# Patient Record
Sex: Male | Born: 1985 | Hispanic: No | Marital: Single | Smoking: Current some day smoker
Health system: Southern US, Community
[De-identification: ages and names within clinical notes are randomized; demographics above are authoritative.]

---
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
## 2020-06-14 ENCOUNTER — Other Ambulatory Visit: Payer: Self-pay

## 2020-06-14 ENCOUNTER — Emergency Department (HOSPITAL_COMMUNITY)
Admission: EM | Admit: 2020-06-14 | Discharge: 2020-06-14 | Disposition: A | Discharge status: Home / Self Care | Payer: Self-pay | Attending: Emergency Medicine | Admitting: Emergency Medicine

## 2020-06-14 DIAGNOSIS — Z5321 Procedure and treatment not carried out due to patient leaving prior to being seen by health care provider: Secondary | ICD-10-CM | POA: Insufficient documentation

## 2020-06-14 DIAGNOSIS — R110 Nausea: Secondary | ICD-10-CM | POA: Insufficient documentation

## 2020-06-14 DIAGNOSIS — R109 Unspecified abdominal pain: Secondary | ICD-10-CM | POA: Insufficient documentation

## 2020-06-14 NOTE — ED Provider Notes (Signed)
Patient left without being seen by provider.  He reportedly ambulated from the department.  I did not have a chance to fully evaluate this patient, however, on my way to see a reportedly critical patient, I did see him sitting in the room on a chair.  No noted pallor, diaphoresis, or evidence of obvious distress.  Vitals:   06/14/20 1317  BP: 139/84  Pulse: 89  Resp: 14  Temp: 98.7 F (37.1 C)  TempSrc: Oral  SpO2: 96%  Weight: 86.2 kg  Height: 5\' 9"  (1.753 m)      , PA-C 06/14/20 1444    Tegeler, 06/16/20, MD 06/16/20 [phone]

## 2020-06-14 NOTE — ED Notes (Addendum)
Left with out being seen @1402 , patient not in room

## 2020-06-14 NOTE — ED Triage Notes (Signed)
Pt with hx of multiple kidney stones here for eval of L flank pain onset 0600 this morning. Endorses nausea without vomiting. Denies urinary complaints.

## 2020-08-29 ENCOUNTER — Encounter (HOSPITAL_COMMUNITY): Payer: Self-pay | Admitting: Psychiatry

## 2020-08-29 ENCOUNTER — Ambulatory Visit (INDEPENDENT_AMBULATORY_CARE_PROVIDER_SITE_OTHER): Payer: No Payment, Other | Admitting: Psychiatry

## 2020-08-29 ENCOUNTER — Other Ambulatory Visit: Payer: Self-pay

## 2020-08-29 DIAGNOSIS — F1590 Other stimulant use, unspecified, uncomplicated: Secondary | ICD-10-CM

## 2020-08-29 DIAGNOSIS — F1121 Opioid dependence, in remission: Secondary | ICD-10-CM | POA: Diagnosis not present

## 2020-08-29 DIAGNOSIS — F3342 Major depressive disorder, recurrent, in full remission: Secondary | ICD-10-CM | POA: Diagnosis not present

## 2020-08-29 DIAGNOSIS — B1920 Unspecified viral hepatitis C without hepatic coma: Secondary | ICD-10-CM

## 2020-08-29 MED ORDER — GUANFACINE HCL ER 1 MG PO TB24: 1.0000 mg | ORAL_TABLET | Freq: Every morning | ORAL | 1 refills | Status: DC

## 2020-08-29 MED ORDER — SERTRALINE HCL 100 MG PO TABS: 100.0000 mg | ORAL_TABLET | Freq: Every day | ORAL | 1 refills | Status: DC

## 2020-08-29 MED ORDER — GABAPENTIN 100 MG PO CAPS: 100.0000 mg | ORAL_CAPSULE | Freq: Three times a day (TID) | ORAL | 1 refills | Status: DC

## 2020-08-29 NOTE — Progress Notes (Signed)
Psychiatric Initial Adult Assessment   Virtual Visit via Video Note  I connected with Zachary Freeman on 08/29/20 at  9:00 AM EDT by a video enabled telemedicine application and verified that I am speaking with the correct person using two identifiers.  Location: Patient: Home Provider: Clinic   I discussed the limitations of evaluation and management by telemedicine and the availability of in person appointments. The patient expressed understanding and agreed to proceed.  I provided 35 minutes of non-face-to-face time during this encounter. Extensive amount of time was spent in reviewing his discharge summary and other paperwork from old [facility] where he was recently hospitalized.    Patient Identification: Zachary Freeman MRN:  [id number] Date of Evaluation:  08/29/2020  Referral Source: Self  Chief Complaint:    Visit Diagnosis:    ICD-10-CM   1. MDD (major depressive disorder), recurrent, in full remission (HCC)  F33.42 sertraline (ZOLOFT) 100 MG tablet    2. Opioid use disorder, severe, in early remission (HCC)  F11.21 gabapentin (NEURONTIN) 100 MG capsule    guanFACINE (INTUNIV) 1 MG TB24 ER tablet    3. Stimulant use disorder, in early remission  F15.90 gabapentin (NEURONTIN) 100 MG capsule    guanFACINE (INTUNIV) 1 MG TB24 ER tablet    4. Hepatitis C test positive  B19.20       History of Present Illness: This is a 35 year old male with history of mood disorder, severe opioid use disorder and stimulant use disorder in addition to hepatitis C now seen for evaluation after he contacted the clinic himself for an appointment.  Patient was recently discharged charged from old [facility], he was hospitalized there from May 7 to May16.  He was hospitalized after presenting there with acute suicidal ideations while undergoing opioid and stimulant withdrawals.  He was medically stabilized and was discharged on the following medications-sertraline 100 mg daily,  gabapentin 100 mg 3 times daily, guanfacine ER 1 mg every morning.  Patient stated that he has been sober for 51 days now and is very happy about that.  He stated that he is back to work, works in [employer] representative and is really glad to be back.  He stated that he really wants to put that part of his life behind him and do well for himself and his family.  He informed that he has been attending NA meetings every day and also has a sponsor now. He stated that the medications that he was prescribed really helped him and he ran out of them about 6 or 7 days ago and he really wants to get back on them as they were very helpful. He stated that he still struggles with poor concentration and used to take Ritalin as a child.  He stated that as he grew older he started misusing various illicit substances and also prescription medications. He stated that he got hooked onto opioid medications and also Adderall.  He stated that he does not have any intention to return back to taking Adderall or Ritalin because he knows that may bring him back to where he was.  He politely asked for refills on the same regimen that he was discharged on because he just wants to continue the same combination of medications as really helped his mood and also his anxiety. He denied any symptoms just of depression such as anhedonia or sad mood.  He is sleeping well at night and his appetite is good.  He denies any suicidal ideations or intention to hurt  himself.  He denied any homicidal ideations.  He denied any hallucinations or delusions.  He lives with his significant other and currently does not have any children. He is hoping to continue to keep this job and make the setting up for his family.  He denied any consumption of alcohol or use of any illicit substances in the last 51 days.  He denied any symptoms suggestive of mania or hypomania. He denied any symptom suggestive of PTSD.  Past Psychiatric History: Extensive history of  substance abuse, opioid use disorder and stimulant use disorder.  Recent psychiatric hospitalization in May 2022.  Previous Psychotropic Medications: Yes   Substance Abuse History in the last 12 months:  Yes.    Consequences of Substance Abuse: Medical Consequences:  needed hospitalization for medical stabilization, contracted hepatitis C Withdrawal Symptoms:   Cramps Diaphoresis Nausea Tremors Vomiting  Past Medical History: Hepatitis C positive  Family Psychiatric History: Denied  Family History: No family history on file.  Social History:   Social History   Socioeconomic History   Marital status: Single    Spouse name: Not on file   Number of children: Not on file   Years of education: Not on file   Highest education level: Not on file  Occupational History   Not on file  Tobacco Use   Smoking status: Not on file   Smokeless tobacco: Not on file  Substance and Sexual Activity   Alcohol use: Not on file   Drug use: Not on file   Sexual activity: Not on file  Other Topics Concern   Not on file  Social History Narrative   Not on file   Social Determinants of Health   Financial Resource Strain: Not on file  Food Insecurity: Not on file  Transportation Needs: Not on file  Physical Activity: Not on file  Stress: Not on file  Social Connections: Not on file    Additional Social History: Lives with his wife, works in the Holiday representative business.  Has his own vehicle.  Does not have any children at present.  Attends NA meetings daily and has a sponsor.  Allergies:  No Known Allergies  Metabolic Disorder Labs: No results found for: HGBA1C, MPG No results found for: PROLACTIN No results found for: CHOL, TRIG, HDL, CHOLHDL, VLDL, LDLCALC No results found for: TSH  Therapeutic Level Labs: No results found for: LITHIUM No results found for: CBMZ No results found for: VALPROATE  Current Medications: Current Outpatient Medications  Medication Sig Dispense Refill    gabapentin (NEURONTIN) 100 MG capsule Take 1 capsule (100 mg total) by mouth 3 (three) times daily. 90 capsule 1   guanFACINE (INTUNIV) 1 MG TB24 ER tablet Take 1 tablet (1 mg total) by mouth in the morning. 30 tablet 1   sertraline (ZOLOFT) 100 MG tablet Take 1 tablet (100 mg total) by mouth daily. 30 tablet 1   No current facility-administered medications for this visit.     Psychiatric Specialty Exam: Review of Systems  There were no vitals taken for this visit.There is no height or weight on file to calculate BMI.  General Appearance: Fairly Groomed  Eye Contact:  Good  Speech:  Clear and Coherent and Normal Rate  Volume:  Normal  Mood:  Euthymic  Affect:  Congruent  Thought Process:  Goal Directed, Linear, and Descriptions of Associations: Intact  Orientation:  Full (Time, Place, and Person)  Thought Content:  Logical  Suicidal Thoughts:  No  Homicidal Thoughts:  No  Memory:  Immediate;   Good Recent;   Good Remote;   Fair  Judgement:  Fair  Insight:  Fair  Psychomotor Activity:  Normal  Concentration:  Concentration: Good and Attention Span: Good  Recall:  Good  Fund of Knowledge:Good  Language: Good  Akathisia:  Negative  Handed:  Right  AIMS (if indicated):  not done  Assets:  Communication Skills Desire for Improvement Housing Transportation Vocational/Educational  ADL's:  Intact  Cognition: WNL  Sleep:  Good   Screenings: GAD-7    Flowsheet Row Office Visit from 08/29/2020 in [facility]  Total GAD-7 Score 2      PHQ2-9    Flowsheet Row Office Visit from 08/29/2020 in [facility]  PHQ-2 Total Score 0      Flowsheet Row Office Visit from 08/29/2020 in [facility] ED from 06/14/2020 in [facility] EMERGENCY DEPARTMENT  C-SSRS RISK CATEGORY No Risk No Risk       Assessment and Plan: Patient seen for establishing care after his hospital  discharge last month.  He is in early remission from severe opioid and stimulant use disorder.  He has been attending any meetings daily and also has a sponsor now.  He is trying his best to maintain his sobriety.  He has a job now and he is planning to maintain it. He would like to continue the same regimen he was discharged on from the hospital.  Refills were sent to his preferred pharmacy.  1. MDD (major depressive disorder), recurrent, in full remission (HCC)  - sertraline (ZOLOFT) 100 MG tablet; Take 1 tablet (100 mg total) by mouth daily.  Dispense: 30 tablet; Refill: 1  2. Opioid use disorder, severe, in early remission (HCC)  - gabapentin (NEURONTIN) 100 MG capsule; Take 1 capsule (100 mg total) by mouth 3 (three) times daily.  Dispense: 90 capsule; Refill: 1 - guanFACINE (INTUNIV) 1 MG TB24 ER tablet; Take 1 tablet (1 mg total) by mouth in the morning.  Dispense: 30 tablet; Refill: 1  3. Stimulant use disorder, in early remission  - gabapentin (NEURONTIN) 100 MG capsule; Take 1 capsule (100 mg total) by mouth 3 (three) times daily.  Dispense: 90 capsule; Refill: 1 - guanFACINE (INTUNIV) 1 MG TB24 ER tablet; Take 1 tablet (1 mg total) by mouth in the morning.  Dispense: 30 tablet; Refill: 1  4. Hepatitis C test positive  Follow-up in 2 months. Patient was informed that his care will be transferred to a different provider as Clinical research associate is leaving the office.   Zena Amos, MD 6/28/20229:50 AM

## 2020-10-20 ENCOUNTER — Other Ambulatory Visit: Payer: Self-pay

## 2020-10-20 ENCOUNTER — Telehealth (HOSPITAL_COMMUNITY): Payer: No Payment, Other | Admitting: Physician Assistant

## 2020-10-22 ENCOUNTER — Encounter (HOSPITAL_COMMUNITY): Payer: Self-pay

## 2020-10-22 ENCOUNTER — Other Ambulatory Visit: Payer: Self-pay

## 2020-10-22 ENCOUNTER — Other Ambulatory Visit (HOSPITAL_COMMUNITY)
Admission: EM | Admit: 2020-10-22 | Discharge: 2020-10-23 | Disposition: A | Discharge status: Home / Self Care | Payer: No Payment, Other | Source: Intra-hospital | Attending: Behavioral Health | Admitting: Behavioral Health

## 2020-10-22 ENCOUNTER — Emergency Department (HOSPITAL_COMMUNITY)
Admission: EM | Admit: 2020-10-22 | Discharge: 2020-10-22 | Disposition: A | Discharge status: Other Acute Inpatient Hospital | Payer: Self-pay | Attending: Emergency Medicine | Admitting: Emergency Medicine

## 2020-10-22 DIAGNOSIS — Y900 Blood alcohol level of less than 20 mg/100 ml: Secondary | ICD-10-CM | POA: Insufficient documentation

## 2020-10-22 DIAGNOSIS — F3342 Major depressive disorder, recurrent, in full remission: Secondary | ICD-10-CM

## 2020-10-22 DIAGNOSIS — F1114 Opioid abuse with opioid-induced mood disorder: Secondary | ICD-10-CM | POA: Diagnosis not present

## 2020-10-22 DIAGNOSIS — F17210 Nicotine dependence, cigarettes, uncomplicated: Secondary | ICD-10-CM | POA: Insufficient documentation

## 2020-10-22 DIAGNOSIS — Z79899 Other long term (current) drug therapy: Secondary | ICD-10-CM | POA: Diagnosis not present

## 2020-10-22 DIAGNOSIS — F1590 Other stimulant use, unspecified, uncomplicated: Secondary | ICD-10-CM

## 2020-10-22 DIAGNOSIS — F1121 Opioid dependence, in remission: Secondary | ICD-10-CM

## 2020-10-22 DIAGNOSIS — F1110 Opioid abuse, uncomplicated: Secondary | ICD-10-CM

## 2020-10-22 DIAGNOSIS — R45851 Suicidal ideations: Secondary | ICD-10-CM | POA: Insufficient documentation

## 2020-10-22 DIAGNOSIS — F1994 Other psychoactive substance use, unspecified with psychoactive substance-induced mood disorder: Secondary | ICD-10-CM | POA: Diagnosis present

## 2020-10-22 DIAGNOSIS — F332 Major depressive disorder, recurrent severe without psychotic features: Secondary | ICD-10-CM | POA: Insufficient documentation

## 2020-10-22 DIAGNOSIS — Z20822 Contact with and (suspected) exposure to covid-19: Secondary | ICD-10-CM | POA: Insufficient documentation

## 2020-10-22 DIAGNOSIS — F1910 Other psychoactive substance abuse, uncomplicated: Secondary | ICD-10-CM | POA: Insufficient documentation

## 2020-10-22 LAB — RESP PANEL BY RT-PCR (FLU A&B, COVID) ARPGX2
Influenza A by PCR: NEGATIVE
Influenza B by PCR: NEGATIVE
SARS Coronavirus 2 by RT PCR: NEGATIVE

## 2020-10-22 LAB — RAPID URINE DRUG SCREEN, HOSP PERFORMED
Amphetamines: NOT DETECTED
Barbiturates: NOT DETECTED
Benzodiazepines: NOT DETECTED
Cocaine: NOT DETECTED
Opiates: NOT DETECTED
Tetrahydrocannabinol: NOT DETECTED

## 2020-10-22 LAB — CBC WITH DIFFERENTIAL/PLATELET
Abs Immature Granulocytes: 0.07 10*3/uL (ref 0.00–0.07)
Basophils Absolute: 0 10*3/uL (ref 0.0–0.1)
Basophils Relative: 0 %
Eosinophils Absolute: 0 10*3/uL (ref 0.0–0.5)
Eosinophils Relative: 0 %
HCT: 38.5 % — ABNORMAL LOW (ref 39.0–52.0)
Hemoglobin: 13.2 g/dL (ref 13.0–17.0)
Immature Granulocytes: 0 %
Lymphocytes Relative: 9 %
Lymphs Abs: 1.6 10*3/uL (ref 0.7–4.0)
MCH: 29.2 pg (ref 26.0–34.0)
MCHC: 34.3 g/dL (ref 30.0–36.0)
MCV: 85.2 fL (ref 80.0–100.0)
Monocytes Absolute: 1.2 10*3/uL — ABNORMAL HIGH (ref 0.1–1.0)
Monocytes Relative: 7 %
Neutro Abs: 15.3 10*3/uL — ABNORMAL HIGH (ref 1.7–7.7)
Neutrophils Relative %: 84 %
Platelets: 226 10*3/uL (ref 150–400)
RBC: 4.52 MIL/uL (ref 4.22–5.81)
RDW: 12.9 % (ref 11.5–15.5)
WBC: 18.2 10*3/uL — ABNORMAL HIGH (ref 4.0–10.5)
nRBC: 0 % (ref 0.0–0.2)

## 2020-10-22 LAB — SALICYLATE LEVEL: Salicylate Lvl: <7 mg/dL — ABNORMAL LOW (ref 7.0–30.0)

## 2020-10-22 LAB — LIPID PANEL
Cholesterol: 180 mg/dL (ref 0–200)
HDL: 48 mg/dL (ref >40)
LDL Cholesterol: 121 mg/dL — ABNORMAL HIGH (ref 0–99)
Total CHOL/HDL Ratio: 3.8 RATIO
Triglycerides: 57 mg/dL (ref <150)
VLDL: 11 mg/dL (ref 0–40)

## 2020-10-22 LAB — HEMOGLOBIN A1C
Hgb A1c MFr Bld: 5.4 % (ref 4.8–5.6)
Mean Plasma Glucose: 108.28 mg/dL

## 2020-10-22 LAB — COMPREHENSIVE METABOLIC PANEL
ALT: 34 U/L (ref 0–44)
AST: 25 U/L (ref 15–41)
Albumin: 4.6 g/dL (ref 3.5–5.0)
Alkaline Phosphatase: 43 U/L (ref 38–126)
Anion gap: 8 (ref 5–15)
BUN: 17 mg/dL (ref 6–20)
CO2: 25 mmol/L (ref 22–32)
Calcium: 9.1 mg/dL (ref 8.9–10.3)
Chloride: 103 mmol/L (ref 98–111)
Creatinine, Ser: 0.83 mg/dL (ref 0.61–1.24)
GFR, Estimated: >60 mL/min (ref >60)
Glucose, Bld: 109 mg/dL — ABNORMAL HIGH (ref 70–99)
Potassium: 3.9 mmol/L (ref 3.5–5.1)
Sodium: 136 mmol/L (ref 135–145)
Total Bilirubin: 1.1 mg/dL (ref 0.3–1.2)
Total Protein: 7.6 g/dL (ref 6.5–8.1)

## 2020-10-22 LAB — ACETAMINOPHEN LEVEL: Acetaminophen (Tylenol), Serum: <10 ug/mL — ABNORMAL LOW (ref 10–30)

## 2020-10-22 LAB — TSH: TSH: 0.61 u[IU]/mL (ref 0.350–4.500)

## 2020-10-22 LAB — ETHANOL: Alcohol, Ethyl (B): <10 mg/dL (ref <10)

## 2020-10-22 MED ORDER — CLONIDINE HCL 0.1 MG PO TABS: 0.1000 mg | ORAL_TABLET | Freq: Four times a day (QID) | ORAL | Status: DC

## 2020-10-22 MED ORDER — TRAZODONE HCL 50 MG PO TABS
50.0000 mg | ORAL_TABLET | Freq: Every evening | ORAL | Status: DC | PRN
Start: 2020-10-22 — End: 2020-10-23

## 2020-10-22 MED ORDER — HYDROXYZINE HCL 25 MG PO TABS: 25.0000 mg | ORAL_TABLET | Freq: Three times a day (TID) | ORAL | Status: DC | PRN

## 2020-10-22 MED ORDER — MAGNESIUM HYDROXIDE 400 MG/5ML PO SUSP: 30.0000 mL | Freq: Every day | ORAL | Status: DC | PRN

## 2020-10-22 MED ORDER — DICYCLOMINE HCL 20 MG PO TABS: 20.0000 mg | ORAL_TABLET | Freq: Four times a day (QID) | ORAL | Status: DC | PRN

## 2020-10-22 MED ORDER — ALUM & MAG HYDROXIDE-SIMETH 200-200-20 MG/5ML PO SUSP: 30.0000 mL | Freq: Four times a day (QID) | ORAL | Status: DC | PRN

## 2020-10-22 MED ORDER — SERTRALINE HCL 50 MG PO TABS: 100.0000 mg | ORAL_TABLET | Freq: Every day | ORAL | Status: DC

## 2020-10-22 MED ORDER — CLONIDINE HCL 0.1 MG PO TABS: 0.1000 mg | ORAL_TABLET | Freq: Two times a day (BID) | ORAL | Status: DC

## 2020-10-22 MED ORDER — ALUM & MAG HYDROXIDE-SIMETH 200-200-20 MG/5ML PO SUSP
30.0000 mL | ORAL | Status: DC | PRN
Start: 2020-10-22 — End: 2020-10-23

## 2020-10-22 MED ORDER — HYDROXYZINE HCL 25 MG PO TABS: 25.0000 mg | ORAL_TABLET | Freq: Four times a day (QID) | ORAL | Status: DC | PRN

## 2020-10-22 MED ORDER — METHOCARBAMOL 500 MG PO TABS: 500.0000 mg | ORAL_TABLET | Freq: Three times a day (TID) | ORAL | Status: DC | PRN

## 2020-10-22 MED ORDER — GABAPENTIN 100 MG PO CAPS
100.0000 mg | ORAL_CAPSULE | Freq: Three times a day (TID) | ORAL | Status: DC
  Administered 2020-10-22 – 2020-10-23 (×4): 100 mg via ORAL
  Filled 2020-10-22 (×4): qty 1

## 2020-10-22 MED ORDER — ACETAMINOPHEN 325 MG PO TABS: 650.0000 mg | ORAL_TABLET | ORAL | Status: DC | PRN

## 2020-10-22 MED ORDER — LOPERAMIDE HCL 2 MG PO CAPS: 2.0000 mg | ORAL_CAPSULE | ORAL | Status: DC | PRN

## 2020-10-22 MED ORDER — ONDANSETRON 4 MG PO TBDP: 4.0000 mg | ORAL_TABLET | Freq: Four times a day (QID) | ORAL | Status: DC | PRN

## 2020-10-22 MED ORDER — SERTRALINE HCL 50 MG PO TABS
50.0000 mg | ORAL_TABLET | Freq: Every day | ORAL | Status: DC
  Administered 2020-10-22 – 2020-10-23 (×2): 50 mg via ORAL
  Filled 2020-10-22 (×2): qty 1

## 2020-10-22 MED ORDER — NICOTINE 21 MG/24HR TD PT24: 21.0000 mg | MEDICATED_PATCH | Freq: Every day | TRANSDERMAL | Status: DC

## 2020-10-22 MED ORDER — ACETAMINOPHEN 325 MG PO TABS: 650.0000 mg | ORAL_TABLET | Freq: Four times a day (QID) | ORAL | Status: DC | PRN

## 2020-10-22 MED ORDER — CLONIDINE HCL 0.1 MG PO TABS: 0.1000 mg | ORAL_TABLET | Freq: Four times a day (QID) | ORAL | Status: DC | PRN

## 2020-10-22 MED ORDER — NAPROXEN 500 MG PO TABS: 500.0000 mg | ORAL_TABLET | Freq: Two times a day (BID) | ORAL | Status: DC | PRN

## 2020-10-22 MED ORDER — CLONIDINE HCL 0.1 MG PO TABS
0.1000 mg | ORAL_TABLET | Freq: Every day | ORAL | Status: DC
Start: 2020-10-26 — End: 2020-10-22

## 2020-10-22 NOTE — ED Provider Notes (Signed)
[facility] Admission Suicide Risk Assessment   Nursing information obtained from:   Medical records, and MD/nursing report. Demographic factors:   caucasian, male Current Mental Status:   alert and oriented. Loss Factors:    Historical Factors: Hx of substance abuse   Total Time spent with patient: 20 minutes Diagnoses:  Final diagnoses:  Substance induced mood disorder (HCC)  Opioid use disorder   Subjective Data: Patient seen and examined face to face by this provider and chart reviewed. On evaluation, the patient is alert and oriented x4. His thought process is logical and speech is coherent. His mood is dysphoric and affect is congruent. He has fair eye contact.  He denies suicidal ideations. He denies homicidal ideations. He denies auditory and visual hallucinations. He does not appear to be responding to internal or external stimuli. He denies paranoia. He states that he was kicked out of the [facility], rather told to leave to seek treatment after he relapsed on Fentanyl. He reported using a half gram of fentanyl. He denies having withdrawal symptoms at this time. He states that prior to relapsing on Fentanyl last night, he was sober for 4 months. He states that he was last hospitalized at [facility] for substance abuse treatment in May 2022.  He states that he was at [facility] in March 2022 for detox. He states that he completed a residential substance abuse program in January 2022. 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 He reports feeling depressed and suicidal with no plan or intent for a couple weeks since he stopped taking his medications. He describes his depressive symptoms as sadness, worthlessness, hopelessness, and isolating. He reports that he was prescribed Zoloft for his depression by Dr Evelene Croon. He states that the Zoloft was effective for treating his  depression. He reports feeling anxious, and describes his anxiety symptoms as sweating, worrying, and racing thoughts. He states that he was prescribed gabapentin for anxiety and states that the gabapentin was effective. He states that he was following up at the [facility] for outpatient  medication management with Dr. Evelene Croon, but not recently. We discussed restarting the Zoloft and gabapentin for mood stabilization.  Patient agrees to the stated medication regimen.  Continued Clinical Symptoms:    The "Alcohol Use Disorders Identification Test", Guidelines for Use in Primary Care, Second Edition.  World Science writer [facility]). Score between 0-7:  no or low risk or alcohol related problems. Score between 8-15:  moderate risk of alcohol related problems. Score between 16-19:  high risk of alcohol related problems. Score 20 or above:  warrants further diagnostic evaluation for alcohol dependence and treatment.   CLINICAL FACTORS:   Depression:   Hopelessness Alcohol/Substance Abuse/Dependencies   Musculoskeletal: Strength & Muscle Tone: within normal limits Gait & Station: normal Patient leans: N/A  Psychiatric Specialty Exam:  Presentation  General Appearance:  Appropriate for Environment Eye Contact: Fair Speech: Clear and Coherent Speech Volume: Normal Handedness: Right  Mood and Affect  Mood: Dysphoric Affect: Congruent  Thought Process  Thought Processes: Coherent; Goal Directed Descriptions of Associations:Intact Orientation:Full (Time, Place and Person) Thought Content:WDL History of Schizophrenia/Schizoaffective disorder:No  Duration of Psychotic Symptoms:No data recorded Hallucinations:Hallucinations: None Ideas of Reference:None Suicidal Thoughts:Suicidal Thoughts: No Homicidal Thoughts:Homicidal Thoughts: No  Sensorium  Memory: Immediate Fair; Recent Fair; Remote Fair Judgment: Fair Insight: Fair  Art therapist  Concentration: Fair Attention Span: Fair Recall: [employer] of Knowledge: Fair Language: Fair  Psychomotor Activity  Psychomotor Activity: Psychomotor Activity: Normal  Assets  Assets: Manufacturing systems engineer; Desire for Improvement; Housing; Health and safety inspector; Leisure Time; Physical Health; Social Support  Sleep  Sleep: Sleep: Fair Number of Hours of Sleep: 6   Physical Exam: Physical Exam HENT:     Head: Normocephalic.     Nose: Nose normal.  Eyes:     Conjunctiva/sclera: Conjunctivae normal.  Cardiovascular:     Rate and Rhythm: Normal rate.  Musculoskeletal:        General: Normal range of motion.     Cervical back: Normal range of motion.  Neurological:     Mental Status: He is alert and oriented to person, place, and time.   Review of Systems  Constitutional: Negative.   HENT: Negative.    Eyes: Negative.   Respiratory: Negative.    Cardiovascular: Negative.   Gastrointestinal: Negative.   Genitourinary: Negative.   Musculoskeletal: Negative.   Skin: Negative.   Neurological: Negative.   Endo/Heme/Allergies: Negative.   Psychiatric/Behavioral:  Positive for depression and substance abuse. Negative for hallucinations and suicidal ideas.    Blood pressure 131/79, pulse 86, temperature 97.7 F (36.5 C), temperature source Tympanic, resp. rate 18, SpO2 100 %. There is no height or weight on file to calculate BMI.   COGNITIVE FEATURES THAT CONTRIBUTE TO RISK:  None    SUICIDE RISK:   Minimal: No identifiable suicidal ideation.  Patients presenting with no risk factors but with morbid ruminations; may be classified as minimal risk based on the severity of the depressive symptoms  PLAN OF CARE:  Patient admitted to the [facility] behavioral health facility based crisis for safety and mood stabilization.   Medications restarted:  gabapentin  100 mg Oral TID   sertraline  50 mg Oral Daily  *Vistaril 25 mg po TID PRN for  anxiety *Trazodone 50 mg QHS PRN for sleep. *Clonidine 0.1 mg PO q 6 hours for opiate withdrawal symptoms  *COWS scale every 8 hours    Lab Orders         Hemoglobin A1c         Lipid panel         TSH     Labs reviewed.   I certify that inpatient services furnished can reasonably be expected to improve the patient's condition.   Ethelene Closser L, NP 10/22/2020, 10:41 AM

## 2020-10-22 NOTE — ED Provider Notes (Addendum)
Behavioral Health Admission H&P [facility] & OBS)  Date: 10/22/20 Patient Name: Zachary Freeman MRN: [id number] Chief Complaint:   "Off of Zoloft for the past several weeks. He reports that since going off the medication he has had increased depression and passive suicidal thinking."    Diagnoses:  Final diagnoses:  Substance induced mood disorder (HCC)  Opioid abuse (HCC)    HPI: Patient seen and examined face to face by this provider and chart reviewed. On evaluation, the patient is alert and oriented x4. His thought process is logical and speech is coherent. His mood is dysphoric and affect is congruent. He has fair eye contact.  He denies suicidal ideations. He denies homicidal ideations. He denies auditory and visual hallucinations. He does not appear to be responding to internal or external stimuli. He denies paranoia. He states that he was kicked out of the [facility], rather told to leave to seek treatment after he relapsed on Fentanyl. He reported using a half gram of fentanyl. He denies having withdrawal symptoms at this time. He states that prior to relapsing on Fentanyl last night, he was sober for 4 months. He states that he was last hospitalized at [facility] for substance abuse treatment in May 2022.  He states that he was at [facility] in March 2022 for detox. He states that he completed a residential substance abuse program in January 2022. 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 He reports feeling depressed and suicidal with no plan or intent for a couple weeks since he stopped taking his medications. He describes his depressive symptoms as sadness, worthlessness, hopelessness, and isolating. He reports that he was prescribed Zoloft for his depression by Dr Evelene Croon. He states that the Zoloft was effective for treating his depression. He reports feeling anxious,  and describes his anxiety symptoms as sweating, worrying, and racing thoughts. He states that he was prescribed gabapentin for anxiety and states that the gabapentin was effective. He states that he was following up at the [facility] for outpatient  medication management with Dr. Evelene Croon, but not recently. We discussed restarting the Zoloft and gabapentin for mood stabilization.  Patient agrees to the stated medication regimen.  PHQ 2-9:   Flowsheet Row ED from 10/22/2020 in [facility] Most recent reading at 10/22/2020  8:50 AM ED from 10/22/2020 in [facility] Most recent reading at 10/22/2020  2:50 AM Office Visit from 08/29/2020 in [facility] Most recent reading at 08/29/2020  9:49 AM  C-SSRS RISK CATEGORY Low Risk Moderate Risk No Risk        Total Time spent with patient: 30 minutes  Musculoskeletal  Strength & Muscle Tone: within normal limits Gait & Station: normal Patient leans: N/A  Psychiatric Specialty Exam  Presentation General Appearance: Appropriate for Environment  Eye Contact:Fair  Speech:Clear and Coherent  Speech Volume:Normal  Handedness:Right   Mood and Affect  Mood:Dysphoric  Affect:Congruent   Thought Process  Thought Processes:Coherent; Goal Directed  Descriptions of Associations:Intact  Orientation:Full (Time, Place and Person)  Thought Content:WDL  Diagnosis of Schizophrenia or Schizoaffective disorder in past: No   Hallucinations:Hallucinations: None  Ideas of Reference:None  Suicidal Thoughts:Suicidal Thoughts: No  Homicidal Thoughts:Homicidal Thoughts: No   Sensorium  Memory:Immediate Fair; Recent Fair; Remote Fair  Judgment:Fair  Insight:Fair   Art therapist  Concentration:Fair  Attention Span:Fair  Recall:Fair  Fund of Knowledge:Fair  Language:Fair   Psychomotor Activity  Psychomotor Activity:Psychomotor Activity:  Normal   Assets  Assets:Communication Skills; Desire for Improvement; Housing; Health and safety inspectorinancial Resources/Insurance; Leisure Time; Physical Health; Social Support   Sleep  Sleep:Sleep: Fair Number of Hours of Sleep: 6   Nutritional Assessment (For OBS and FBC admissions only) Has the patient had a weight loss or gain of 10 pounds or more in the last 3 months?: No Has the patient had a decrease in food intake/or appetite?: No Does the patient have dental problems?: No Does the patient have eating habits or behaviors that may be indicators of an eating disorder including binging or inducing vomiting?: No Has the patient recently lost weight without trying?: No Has the patient been eating poorly because of a decreased appetite?: No Malnutrition Screening Tool Score: 0   Physical Exam HENT:     Head: Normocephalic.     Nose: Nose normal.  Eyes:     Conjunctiva/sclera: Conjunctivae normal.  Cardiovascular:     Rate and Rhythm: Normal rate.  Pulmonary:     Effort: Pulmonary effort is normal.  Musculoskeletal:     Cervical back: Normal range of motion.  Neurological:     Mental Status: He is alert and oriented to person, place, and time.   Review of Systems  Constitutional: Negative.   HENT: Negative.    Eyes: Negative.   Respiratory: Negative.    Cardiovascular: Negative.   Gastrointestinal: Negative.   Genitourinary: Negative.   Musculoskeletal: Negative.   Skin: Negative.   Neurological: Negative.   Endo/Heme/Allergies: Negative.   Psychiatric/Behavioral:  Positive for depression and substance abuse. The patient is nervous/anxious.    Blood pressure 131/79, pulse 86, temperature 97.7 F (36.5 C), temperature source Tympanic, resp. rate 18, SpO2 100 %. There is no height or weight on file to calculate BMI.  Past Psychiatric History: Hx of MDD, Opioid use dx, and Stimulant use disorder.  Is the patient at risk to self? Yes  Has the patient been a risk to self in the past 6  months? Yes .    Has the patient been a risk to self within the distant past? Yes   Is the patient a risk to others? No   Has the patient been a risk to others in the past 6 months? No   Has the patient been a risk to others within the distant past? No   Past Medical History: No past medical history on file. No past surgical history on file.  Family History: Patient does not report a family hx of mental illness.  Social History: Opiate use. Works full-time as a Location managercrawl space worker. Social History   Socioeconomic History   Marital status: Single    Spouse name: Not on file   Number of children: Not on file   Years of education: Not on file   Highest education level: Not on file  Occupational History   Not on file  Tobacco Use   Smoking status: Not on file   Smokeless tobacco: Not on file  Substance and Sexual Activity   Alcohol use: Not on file   Drug use: Not on file   Sexual activity: Not on file  Other Topics Concern   Not on file  Social History Narrative   Not on file   Social Determinants of Health   Financial Resource Strain: Not on file  Food Insecurity: Not on file  Transportation Needs: Not on  file  Physical Activity: Not on file  Stress: Not on file  Social Connections: Not on file  Intimate Partner Violence: Not on file    SDOH:  SDOH Screenings   Alcohol Screen: Not on file  Depression (PHQ2-9): Low Risk    PHQ-2 Score: 0  Financial Resource Strain: Not on file  Food Insecurity: Not on file  Housing: Not on file  Physical Activity: Not on file  Social Connections: Not on file  Stress: Not on file  Tobacco Use: Not on file  Transportation Needs: Not on file    Last Labs:  Admission on 10/22/2020  Component Date Value Ref Range Status   Cholesterol 10/22/2020 180  0 - 200 mg/dL Final   Triglycerides 94/85/4627 57  <150 mg/dL Final   HDL 03/50/0938 48  >40 mg/dL Final   Total CHOL/HDL Ratio 10/22/2020 3.8  RATIO Final   VLDL 10/22/2020 11  0  - 40 mg/dL Final   LDL Cholesterol 10/22/2020 121 (A) 0 - 99 mg/dL Final   Comment:        Total Cholesterol/HDL:CHD Risk Coronary Heart Disease Risk Table                     Men   Women  1/2 Average Risk   3.4   3.3  Average Risk       5.0   4.4  2 X Average Risk   9.6   7.1  3 X Average Risk  23.4   11.0        Use the calculated Patient Ratio above and the CHD Risk Table to determine the patient's CHD Risk.        ATP III CLASSIFICATION (LDL):  <100     mg/dL   Optimal  182-993  mg/dL   Near or Above                    Optimal  130-159  mg/dL   Borderline  716-967  mg/dL   High  >893     mg/dL   Very High Performed at [facility], [address], [place], [place] [address]   Admission on 10/22/2020, Discharged on 10/22/2020  Component Date Value Ref Range Status   Sodium 10/22/2020 136  135 - 145 mmol/L Final   Potassium 10/22/2020 3.9  3.5 - 5.1 mmol/L Final   Chloride 10/22/2020 103  98 - 111 mmol/L Final   CO2 10/22/2020 25  22 - 32 mmol/L Final   Glucose, Bld 10/22/2020 109 (A) 70 - 99 mg/dL Final   Glucose reference range applies only to samples taken after fasting for at least 8 hours.   BUN 10/22/2020 17  6 - 20 mg/dL Final   Creatinine, Ser 10/22/2020 0.83  0.61 - 1.24 mg/dL Final   Calcium 51/04/5850 9.1  8.9 - 10.3 mg/dL Final   Total Protein 77/82/4235 7.6  6.5 - 8.1 g/dL Final   Albumin 36/14/4315 4.6  3.5 - 5.0 g/dL Final   AST 40/10/6759 25  15 - 41 U/L Final   ALT 10/22/2020 34  0 - 44 U/L Final   Alkaline Phosphatase 10/22/2020 43  38 - 126 U/L Final   Total Bilirubin 10/22/2020 1.1  0.3 - 1.2 mg/dL Final   GFR, Estimated 10/22/2020 >60  >60 mL/min Final   Comment: (NOTE) Calculated using the CKD-EPI Creatinine Equation (2021)    Anion gap 10/22/2020 8  5 - 15 Final  Performed at [facility], [address], [place], [place] [address]   Acetaminophen (Tylenol), Serum 10/22/2020 <10 (A) 10 - 30 ug/mL Final    Comment: (NOTE) Therapeutic concentrations vary significantly. A range of 10-30 ug/mL  may be an effective concentration for many patients. However, some  are best treated at concentrations outside of this range. Acetaminophen concentrations >150 ug/mL at 4 hours after ingestion  and >50 ug/mL at 12 hours after ingestion are often associated with  toxic reactions.  Performed at [facility], [address], [place], [place] [address]    Alcohol, Ethyl (B) 10/22/2020 <10  <10 mg/dL Final   Comment: (NOTE) Lowest detectable limit for serum alcohol is 10 mg/dL.  For medical purposes only. Performed at [facility], [address], [place], [place] [address]    Salicylate Lvl 10/22/2020 <7.0 (A) 7.0 - 30.0 mg/dL Final   Performed at [facility], [address], [place], [place] [address]   WBC 10/22/2020 18.2 (A) 4.0 - 10.5 K/uL Final   RBC 10/22/2020 4.52  4.22 - 5.81 MIL/uL Final   Hemoglobin 10/22/2020 13.2  13.0 - 17.0 g/dL Final   HCT 24/26/8341 38.5 (A) 39.0 - 52.0 % Final   MCV 10/22/2020 85.2  80.0 - 100.0 fL Final   MCH 10/22/2020 29.2  26.0 - 34.0 pg Final   MCHC 10/22/2020 34.3  30.0 - 36.0 g/dL Final   RDW 96/22/2979 12.9  11.5 - 15.5 % Final   Platelets 10/22/2020 226  150 - 400 K/uL Final   nRBC 10/22/2020 0.0  0.0 - 0.2 % Final   Neutrophils Relative % 10/22/2020 84  % Final   Neutro Abs 10/22/2020 15.3 (A) 1.7 - 7.7 K/uL Final   Lymphocytes Relative 10/22/2020 9  % Final   Lymphs Abs 10/22/2020 1.6  0.7 - 4.0 K/uL Final   Monocytes Relative 10/22/2020 7  % Final   Monocytes Absolute 10/22/2020 1.2 (A) 0.1 - 1.0 K/uL Final   Eosinophils Relative 10/22/2020 0  % Final   Eosinophils Absolute 10/22/2020 0.0  0.0 - 0.5 K/uL Final   Basophils Relative 10/22/2020 0  % Final   Basophils Absolute 10/22/2020 0.0  0.0 - 0.1 K/uL Final   Immature Granulocytes 10/22/2020 0  % Final   Abs Immature Granulocytes  10/22/2020 0.07  0.00 - 0.07 K/uL Final   Performed at [facility], [address] [place], [place] [address]   Opiates 10/22/2020 NONE DETECTED  NONE DETECTED Final   Cocaine 10/22/2020 NONE DETECTED  NONE DETECTED Final   Benzodiazepines 10/22/2020 NONE DETECTED  NONE DETECTED Final   Amphetamines 10/22/2020 NONE DETECTED  NONE DETECTED Final   Tetrahydrocannabinol 10/22/2020 NONE DETECTED  NONE DETECTED Final   Barbiturates 10/22/2020 NONE DETECTED  NONE DETECTED Final   Comment: (NOTE) DRUG SCREEN FOR MEDICAL PURPOSES ONLY.  IF CONFIRMATION IS NEEDED FOR ANY PURPOSE, NOTIFY LAB WITHIN 5 DAYS.  LOWEST DETECTABLE LIMITS FOR URINE DRUG SCREEN Drug Class                     Cutoff (ng/mL) Amphetamine and metabolites    1000 Barbiturate and metabolites    200 Benzodiazepine                 200 Tricyclics and metabolites     300 Opiates and metabolites        300 Cocaine and metabolites  300 THC                            50 Performed at [facility], [address], [place], [place] [address]    SARS Coronavirus 2 by RT PCR 10/22/2020 NEGATIVE  NEGATIVE Final   Comment: (NOTE) SARS-CoV-2 target nucleic acids are NOT DETECTED.  The SARS-CoV-2 RNA is generally detectable in upper respiratory specimens during the acute phase of infection. The lowest concentration of SARS-CoV-2 viral copies this assay can detect is 138 copies/mL. A negative result does not preclude SARS-Cov-2 infection and should not be used as the sole basis for treatment or other patient management decisions. A negative result may occur with  improper specimen collection/handling, submission of specimen other than nasopharyngeal swab, presence of viral mutation(s) within the areas targeted by this assay, and inadequate number of viral copies(<138 copies/mL). A negative result must be combined with clinical observations, patient history, and  epidemiological information. The expected result is Negative.  Fact Sheet for Patients:  BloggerCourse.com  Fact Sheet for Healthcare Providers:  SeriousBroker.it  This test is no                          t yet approved or cleared by the [place] FDA and  has been authorized for detection and/or diagnosis of SARS-CoV-2 by FDA under an Emergency Use Authorization (EUA). This EUA will remain  in effect (meaning this test can be used) for the duration of the COVID-19 declaration under Section 564(b)(1) of the Act, 21 U.S.C.section 360bbb-3(b)(1), unless the authorization is terminated  or revoked sooner.       Influenza A by PCR 10/22/2020 NEGATIVE  NEGATIVE Final   Influenza B by PCR 10/22/2020 NEGATIVE  NEGATIVE Final   Comment: (NOTE) The Xpert Xpress SARS-CoV-2/FLU/RSV plus assay is intended as an aid in the diagnosis of influenza from Nasopharyngeal swab specimens and should not be used as a sole basis for treatment. Nasal washings and aspirates are unacceptable for Xpert Xpress SARS-CoV-2/FLU/RSV testing.  Fact Sheet for Patients: BloggerCourse.com  Fact Sheet for Healthcare Providers: SeriousBroker.it  This test is not yet approved or cleared by the [place] FDA and has been authorized for detection and/or diagnosis of SARS-CoV-2 by FDA under an Emergency Use Authorization (EUA). This EUA will remain in effect (meaning this test can be used) for the duration of the COVID-19 declaration under Section 564(b)(1) of the Act, 21 U.S.C. section 360bbb-3(b)(1), unless the authorization is terminated or revoked.  Performed at [facility], [address] [place], [place] [address]     Allergies: Patient has no known allergies.  PTA Medications: Zoloft, Intuniv and Neurontin.  Medical Decision Making  Patient admitted to the [facility]  behavioral health facility based crisis for safety and mood stabilization.   Medications restarted:  gabapentin  100 mg Oral TID   sertraline  50 mg Oral Daily  *Vistaril 25 mg po TID PRN for anxiety *Trazodone 50 mg QHS PRN for sleep. *Clonidine 0.1 mg PO q 6 hours for opiate withdrawal symptoms  *COWS scale every 8 hours    Lab Orders         Hemoglobin A1c         Lipid panel         TSH     Labs reviewed.   Disposition: ongoing with CSW. Patient plans to return back to  the [facility] in [place], [place].  Recommendations  Based on my evaluation the patient does not appear to have an emergency medical condition.  Layla Barter, NP 10/22/20  10:43 AM

## 2020-10-22 NOTE — ED Notes (Signed)
Pt did not participate in group session, stated he was not feeling well.

## 2020-10-22 NOTE — ED Notes (Signed)
Pt given breakfast.

## 2020-10-22 NOTE — ED Notes (Signed)
Pt sleeping but easily aroused to name being called. Received afternoon med without difficulty. Pt states, "I just want to catch up on some sleep. I appreciate all you're doing". Informed pt to notify staff with any needs or concerns. Will continue to monitor for safety.

## 2020-10-22 NOTE — ED Provider Notes (Signed)
Emergency Department Provider Note   I have reviewed the triage vital signs and the nursing notes.   HISTORY  Chief Complaint Suicidal   HPI Zachary Freeman is a 35 y.o. male with past medical history reviewed below presents to the emergency department with suicidal thoughts.  Patient has been off of his Zoloft for the past several weeks.  He reports that since going off the medication he has had increased depression and passive suicidal thinking.  He has not formulated a specific plan to harm himself but has had increasingly intrusive thoughts regarding this topic.  He is not feeling homicidal.  No auditory or visual hallucinations.  Patient does smoke and snort opiates and last smoked fentanyl this evening.  He has no medical complaints at this time.   No past medical history on file.  Patient Active Problem List   Diagnosis Date Noted   MDD (major depressive disorder), recurrent, in full remission (HCC) 08/29/2020   Opioid use disorder, severe, in early remission (HCC) 08/29/2020   Stimulant use disorder, in early remission 08/29/2020   Hepatitis C test positive 08/29/2020    No past surgical history on file.  Allergies Patient has no known allergies.  No family history on file.  Social History    Review of Systems  Constitutional: No fever/chills Eyes: No visual changes. ENT: No sore throat. Cardiovascular: Denies chest pain. Respiratory: Denies shortness of breath. Gastrointestinal: No abdominal pain.  No nausea, no vomiting.  No diarrhea.  No constipation. Genitourinary: Negative for dysuria. Musculoskeletal: Negative for back pain. Skin: Negative for rash. Neurological: Negative for headaches, focal weakness or numbness. Psychiatric: Positive SI.   10-point ROS otherwise negative.  ____________________________________________   PHYSICAL EXAM:  VITAL SIGNS: ED Triage Vitals  Enc Vitals Group     BP 10/22/20 0155 (!) 151/88     Pulse Rate 10/22/20  0155 76     Resp 10/22/20 0155 18     Temp 10/22/20 0155 98.4 F (36.9 C)     Temp Source 10/22/20 0155 Oral     SpO2 10/22/20 0155 100 %     Weight 10/22/20 0155 190 lb (86.2 kg)     Height 10/22/20 0155 5\' 9"  (1.753 m)   Constitutional: Alert and oriented. Well appearing and in no acute distress. Eyes: Conjunctivae are normal.  Head: Atraumatic. Nose: No congestion/rhinnorhea. Mouth/Throat: Mucous membranes are moist.   Neck: No stridor.   Cardiovascular: Normal rate, regular rhythm. Good peripheral circulation. Grossly normal heart sounds.   Respiratory: Normal respiratory effort.  No retractions. Lungs CTAB. Gastrointestinal: Soft and nontender. No distention.  Musculoskeletal: No lower extremity tenderness nor edema. No gross deformities of extremities. Neurologic:  Normal speech and language. No gross focal neurologic deficits are appreciated.  Skin:  Skin is warm, dry and intact. No rash noted. Psychiatric: Mood and affect are normal. Speech and behavior are normal.  ____________________________________________   LABS (all labs ordered are listed, but only abnormal results are displayed)  Labs Reviewed  COMPREHENSIVE METABOLIC PANEL - Abnormal; Notable for the following components:      Result Value   Glucose, Bld 109 (*)    All other components within normal limits  ACETAMINOPHEN LEVEL - Abnormal; Notable for the following components:   Acetaminophen (Tylenol), Serum <10 (*)    All other components within normal limits  SALICYLATE LEVEL - Abnormal; Notable for the following components:   Salicylate Lvl <7.0 (*)    All other components within normal limits  CBC  WITH DIFFERENTIAL/PLATELET - Abnormal; Notable for the following components:   WBC 18.2 (*)    HCT 38.5 (*)    Neutro Abs 15.3 (*)    Monocytes Absolute 1.2 (*)    All other components within normal limits  RESP PANEL BY RT-PCR (FLU A&B, COVID) ARPGX2  ETHANOL  RAPID URINE DRUG SCREEN, HOSP PERFORMED    ____________________________________________  RADIOLOGY   None  ____________________________________________   PROCEDURES  Procedure(s) performed:   Procedures  None ____________________________________________   INITIAL IMPRESSION / ASSESSMENT AND PLAN / ED COURSE  Pertinent labs & imaging results that were available during my care of the patient were reviewed by me and considered in my medical decision making (see chart for details).   Patient presents to the emergency department with suicidal ideation in setting of substance abuse and med noncompliance.  He is calm and cooperative here.  Not responding to internal stimuli.  Gets an honest assessment of his substance use.  No IV drug history.  No medical complaints at this time.  Vital signs are within normal limits.  Plan to restart the patient's Zoloft.  COVID is negative.  Nonspecific leukocytosis noted on labs but no infection signs or symptoms. Patient is medically clear for TTS evaluation. Detox PRN meds ordered as well. Patient denies specific EtOH withdrawal history.   05:41 AM  Evaluated by TTS. Accepted for admit to [facility]. Ready for transport after 7 AM.  ____________________________________________  FINAL CLINICAL IMPRESSION(S) / ED DIAGNOSES  Final diagnoses:  Suicidal ideation  Polysubstance abuse (HCC)     MEDICATIONS GIVEN DURING THIS VISIT:  Medications  sertraline (ZOLOFT) tablet 100 mg (has no administration in time range)  cloNIDine (CATAPRES) tablet 0.1 mg (has no administration in time range)    Followed by  cloNIDine (CATAPRES) tablet 0.1 mg (has no administration in time range)    Followed by  cloNIDine (CATAPRES) tablet 0.1 mg (has no administration in time range)  dicyclomine (BENTYL) tablet 20 mg (has no administration in time range)  hydrOXYzine (ATARAX/VISTARIL) tablet 25 mg (has no administration in time range)  loperamide (IMODIUM) capsule 2-4 mg (has no administration in time range)   methocarbamol (ROBAXIN) tablet 500 mg (has no administration in time range)  naproxen (NAPROSYN) tablet 500 mg (has no administration in time range)  ondansetron (ZOFRAN-ODT) disintegrating tablet 4 mg (has no administration in time range)  alum & mag hydroxide-simeth (MAALOX/MYLANTA) 200-200-20 MG/5ML suspension 30 mL (has no administration in time range)  nicotine (NICODERM CQ - dosed in mg/24 hours) patch 21 mg (has no administration in time range)  acetaminophen (TYLENOL) tablet 650 mg (has no administration in time range)    Note:  This document was prepared using Dragon voice recognition software and may include unintentional dictation errors.  [name], MD, [facility] Emergency Medicine    Jovon Streetman, Arlyss Repress, MD 10/22/20 [phone]

## 2020-10-22 NOTE — ED Notes (Signed)
[facility] transfer d/t c/o worsening depression, passive SI after smoking fentanyl. Pt reports opiate abuse. Calm, cooperative throughout interview process. Pt states, "I'm not really suicidal but I am depressed. [facility] because [facility] refused to get tested after I smoked some Fentanyl. I need somewhere to stay. If I don't like it here, can I leave? I don't want no rehab or anything. I'm just keeping it real with you".Provider notified of pt statements. Skin assessment completed. Oriented to unit. Meal and drink offered. At currrent, Pt verbally contract for safety. Will monitor for safety.

## 2020-10-22 NOTE — ED Notes (Signed)
Pt eating lunch in no acute distress. Denies concerns at present. Safety maintained. 
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
## 2020-10-22 NOTE — ED Notes (Signed)
Pt given lunch

## 2020-10-22 NOTE — ED Notes (Signed)
Pt given dinner  

## 2020-10-22 NOTE — ED Notes (Signed)
Patient is currently in his room

## 2020-10-22 NOTE — ED Notes (Signed)
Pt changed into behavioral health attire, belongings collected and placed in cabinet above nurses station.

## 2020-10-22 NOTE — ED Triage Notes (Addendum)
Pt reports he feels depressed and suicidal, states he was taking antidepressants but has ran out of the medication and has been so busy at work he has not had time to get the medication refilled.  Pt reports his last dose of zoloft was maybe 3 or 4 weeks ago.  Pt denies having ever harmed himself, denies having a plan to harm himself, but recently has had thoughts of ending his life. Pt is currently trying to detox on his own off of opiates.  Pt reports he smoked fentanyl tonight around midnight.

## 2020-10-22 NOTE — ED Notes (Signed)
Pt given snack. 

## 2020-10-22 NOTE — ED Notes (Signed)
Pt currently on telephone. No acute distress noted. Safety maintained. 

## 2020-10-22 NOTE — ED Notes (Signed)
Pt is in room sleeping, respirations are even and unlabored, environment check has been complete, will continue to monitor patient for safety

## 2020-10-23 DIAGNOSIS — Z79899 Other long term (current) drug therapy: Secondary | ICD-10-CM | POA: Diagnosis not present

## 2020-10-23 DIAGNOSIS — F1114 Opioid abuse with opioid-induced mood disorder: Secondary | ICD-10-CM | POA: Diagnosis not present

## 2020-10-23 DIAGNOSIS — F17210 Nicotine dependence, cigarettes, uncomplicated: Secondary | ICD-10-CM | POA: Diagnosis not present

## 2020-10-23 MED ORDER — GABAPENTIN 100 MG PO CAPS: 100.0000 mg | ORAL_CAPSULE | Freq: Three times a day (TID) | ORAL | 0 refills | Status: DC

## 2020-10-23 MED ORDER — SERTRALINE HCL 100 MG PO TABS: 50.0000 mg | ORAL_TABLET | Freq: Every day | ORAL | 0 refills | Status: DC

## 2020-10-23 MED ORDER — HYDROXYZINE HCL 25 MG PO TABS: 25.0000 mg | ORAL_TABLET | Freq: Three times a day (TID) | ORAL | 0 refills | Status: DC | PRN

## 2020-10-23 NOTE — Progress Notes (Signed)
Zachary Freeman to be D/C'd Home per NP order. Discussed with the patient and all questions fully answered. An After Visit Summary was printed and given to the patient. Medication scripts were also given to patient. Patient escorted via out and D/C home via private auto.  [name]  10/23/2020 11:55 AM

## 2020-10-23 NOTE — ED Provider Notes (Signed)
[facility] Discharge Suicide Risk Assessment   Principal Problem: <principal problem not specified> Discharge Diagnoses: Active Problems:   Substance induced mood disorder (HCC)  Total Time spent with patient: 30 minutes  Subjective:  On today's evaluation Zachary Freeman is calm/cooperative, alert/oriented x 4, with pleasant affect, and does not appear to be responding to internal/external stimuli. He is requesting to leave, and states, "I am doing much better I think I ready to go". He is in sitting position and makes good eye contact. He is attentive. He is fairly groomed. Patient reports eating/sleeping without difficulty, tolerating medications without adverse reactions, and attending/participating in group sessions. Patient denied homicidal/self-harm ideation and paranoia.  Denies auditory and visual hallucinations.  Denies suicidal ideations. Denies any intent, plan or access to firearms/weapons.  Patient states he only used fentanyl one time since he has been at [facility].States he was kicked out for substance use. States he plans on discharge is to return to [facility] if he is accepted back in. Discussed negative UDS.  Patient states, "I was put into a position to where I had to say I was using one half a gram of fentanyl a day, sorry I lied but I had to I had no where else to go".  States he is ready to get back to work. Reassurance, support, and encouragement provided.   Declined collateral to be called.    Stay Summary: Zachary Freeman was admitted to the [facility] unit for substance use and crisis management. Hewas treated with the following medications Zoloft, Hydroxyzine and Gabapentin which were tolerated with no adverse reactions.   Zachary Freeman was discharged with current medication and was instructed on how to take medications as prescribed. Discussed refraining from alcohol or other substances while taking prescription medications.    Zachary Freeman's improvement was monitored and his  report of symptom reduction. His emotional and mental status was also monitored by staff.  He was evaluated for stability and plans for continued recovery upon discharge.   The following was addressed as part of his discharge planning and follow up treatment:  Employment, housing, transportation, bed availability, health status, family support, and any pending legal issues were also considered during his admission. He was offered further treatment options upon discharge including but not limited to Residential, Intensive Outpatient, Outpatient treatment, Rehabilitation services, and resources for shelters and Half-way-house if needed.  Zachary Freeman will follow up with the services as listed below under Follow up Information.      Upon completion of this admission the Zachary Freeman was both mentally and medically stable for discharge denying suicidal/homicidal ideation, auditory/visual/tactile hallucinations, delusional thoughts and paranoia  Musculoskeletal: Strength & Muscle Tone: within normal limits Gait & Station: normal Patient leans: N/A  Psychiatric Specialty Exam  Presentation  General Appearance: Appropriate for Environment; Casual  Eye Contact:Good  Speech:Clear and Coherent; Normal Rate  Speech Volume:Normal  Handedness:Right   Mood and Affect  Mood:Euthymic  Duration of Depression Symptoms: Less than two weeks  Affect:Appropriate; Congruent   Thought Process  Thought Processes:Coherent  Descriptions of Associations:Intact  Orientation:Full (Time, Place and Person)  Thought Content:Logical  History of Schizophrenia/Schizoaffective disorder:No  Duration of Psychotic Symptoms:No data recorded Hallucinations:Hallucinations: None  Ideas of Reference:None  Suicidal Thoughts:Suicidal Thoughts: No  Homicidal Thoughts:Homicidal Thoughts: No   Sensorium  Memory:Immediate Good; Recent Good; Remote Good  Judgment:Good  Insight:Good   Executive Functions   Concentration:Good  Attention Span:Good  Recall:Good  Fund of Knowledge:Good  Language:Good   Psychomotor Activity  Psychomotor  Activity:Psychomotor Activity: Normal   Assets  Assets:Communication Skills; Desire for Improvement; Financial Resources/Insurance; Housing; Physical Health; Resilience; Social Support   Sleep  Sleep:Sleep: Fair Number of Hours of Sleep: 6   Physical Exam: Physical Exam see discharge summary  ROSsee discharge summary  Blood pressure 113/60, pulse 81, temperature 98.9 F (37.2 C), temperature source Oral, resp. rate 18, SpO2 97 %. There is no height or weight on file to calculate BMI.  Mental Status Per Nursing Assessment::   On Admission:  H&P admission note on 10/22/2020.  Patient seen and examined face to face by this provider and chart reviewed. On evaluation, the patient is alert and oriented x4. His thought process is logical and speech is coherent. His mood is dysphoric and affect is congruent. He has fair eye contact.  He denies suicidal ideations. He denies homicidal ideations. He denies auditory and visual hallucinations. He does not appear to be responding to internal or external stimuli. He denies paranoia. He states that he was kicked out of the [facility] house, rather told to leave to seek treatment after he relapsed on Fentanyl. He reported using a half gram of fentanyl. He denies having withdrawal symptoms at this time. He states that prior to relapsing on Fentanyl last night, he was sober for 4 months. He states that he was last hospitalized at [facility] for substance abuse treatment in May 2022.  He states that he was at [facility] in March 2022 for detox. He states that he completed a residential substance abuse program in January 2022. 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 He reports feeling depressed and  suicidal with no plan or intent for a couple weeks since he stopped taking his medications. He describes his depressive symptoms as sadness, worthlessness, hopelessness, and isolating. He reports that he was prescribed Zoloft for his depression by Dr Evelene Croon. He states that the Zoloft was effective for treating his depression. He reports feeling anxious, and describes his anxiety symptoms as sweating, worrying, and racing thoughts. He states that he was prescribed gabapentin for anxiety and states that the gabapentin was effective. He states that he was following up at the [facility] for outpatient  medication management with Dr. Evelene Croon, but not recently. We discussed restarting the Zoloft and gabapentin for mood stabilization.  Patient agrees to the stated medication regimen.  Demographic Factors:  Male, Caucasian, and Low socioeconomic status  Loss Factors: Financial problems/change in socioeconomic status  Historical Factors: Impulsivity  Risk Reduction Factors:   Sense of responsibility to family, Religious beliefs about death, Employed, Positive social support, Positive therapeutic relationship, and Positive coping skills or problem solving skills  Continued Clinical Symptoms:  Depression:   Comorbid alcohol abuse/dependence Impulsivity Alcohol/Substance Abuse/Dependencies  Cognitive Features That Contribute To Risk:  None    Suicide Risk:  Minimal: No identifiable suicidal ideation.  Patients presenting with no risk factors but with morbid ruminations; may be classified as minimal risk based on the severity of the depressive symptoms   Follow-up Information     [facility] Follow up.   Specialty: Behavioral Health Why: To establish outpatient psychiatric services please go during walk-in hours.   Medication management walk in hours are Monday-Friday from 8:00am-11:00am  Therapy services walk-in hours are Monday-Wednesday from 8:00am-until slots are full.  Friday from 1:00pm-5:00pm.   Be sure  to provide any discharge paperwork from this encounter. Contact information: [address] [place] [address] [phone]        Services, Alcohol And Drug Follow up.   Specialty: Behavioral Health Why: Please contact to establish OUTPATIENT substance abuse services. Be sure to have any discharge paperwork from this encounter. Contact information: [address] [place] [address] [phone]                 Plan Of Care/Follow-up recommendations:   Activity:  as tolerated  Diet:  regular  Discharge patient.    Printed prescription provided for Zoloft 50 mg p.o. daily, hydroxyzine 25 mg p.o. 3 times daily as needed, gabapentin 100 mg p.o. 3 times daily. 30 day prescription with no refill.   Provided outpatient psychiatric resources including [facility] behavioral health outpatient services on the second floor, explained open access walk-in hours.     Social work provided outpatient services for substance abuse services.   No evidence of imminent risk to self or others at present.    Patient does not meet criteria for psychiatric inpatient admission. Discussed crisis plan, support from social network, calling 911, coming to the Emergency Department, and calling Suicide Hotline.    Ardis Hughs, NP 10/23/2020, 9:58 AM

## 2020-10-23 NOTE — Clinical Social Work Psych Note (Signed)
CSW Discharge Note  Patient was psychiatrically cleared for discharge per Dr. Bronwen Betters, MD and Vernard Gambles, NP.   Patient shared that he came to [facility] due to being recently kicked out of an [address] due to a positive drug screening. Patient denied any SI, HI, or AVH during or prior to this encounter.   Patient endorsed his substance use, however denied wanting any residential/rehabilitation services at this time. Patient was agreeable for outpatient psychiatric resources. Resources were charted in patient's AVS prior to discharge.    Patient discharged home with his girlfriend. Patient did not express any additional questions or concerns.      Zachary Freeman, MSW, LCSW Clinical Child psychotherapist (Facility Based Crisis) [facility]

## 2020-10-23 NOTE — Progress Notes (Signed)
Patient sitting in dayroom watching television.  Continue to monitor for safety. °

## 2020-10-23 NOTE — ED Notes (Signed)
Patient out for breakfast and currently resting on bed.  Stated that he's ready to go to his halfway house.  Denied SI, HI, AVH.  Rated anxiety 5/10 and depression 6/10.

## 2020-10-23 NOTE — Progress Notes (Signed)
Patient to desk asking about the process for signing himself out.  Explained that I would let his provider know that he was requesting discharge.  Provider currently not on unit.  Notified Jolan, SW.

## 2020-10-23 NOTE — Clinical Social Work Psych Note (Signed)
CSW Update   CSW met with patient for introduction and to begin discussing discharge plans.   Patient shared with CSW that he was ready to "sign himself out". Patient reports that he came to the facility due being kicked out of his previous transitional housing due to using Fentanyl.   Patient denied having any SI, HI and AVH.   Patient declined any residential/rehabilitation programs to address his substance use issues. Patient did express interest in outpatient substance use and psychiatric services at discharge.   Patient requested to speak with a provider regarding his discharge. He reports he does not have any additional question or concerns for CSW at this time.    CSW will continue to follow.    Radonna Ricker, MSW, LCSW Clinical Education officer, museum ([place]) [facility]

## 2020-10-23 NOTE — Discharge Instructions (Addendum)

## 2020-10-23 NOTE — ED Notes (Signed)
Pt is currently sleeping, respirations are even and unlabored, environment check complete/secure, will continue to monitor patient for safety 

## 2020-10-23 NOTE — ED Provider Notes (Signed)
FBC/OBS ASAP Discharge Summary  Date and Time: 10/23/2020 9:56 AM  Name: Zachary Freeman  MRN:  [id number]   Discharge Diagnoses:  Final diagnoses:  Substance induced mood disorder (HCC)  Opioid abuse (HCC)    Subjective:  Zachary Freeman, 35 y.o., male patient seen face to face by this provider, chart reviewed and discussed with treatment team and Dr. Bronwen Betters on 10/23/20.    On evaluation [name] is calm/cooperative, alert/oriented x 4, with pleasant affect, and does not appear to be responding to internal/external stimuli. He is requesting to leave, and states, "I am doing much better I think I ready to go". He is in sitting position and makes good eye contact. He is attentive. He is fairly groomed. Patient reports eating/sleeping without difficulty, tolerating medications without adverse reactions, and attending/participating in group sessions. Patient denied homicidal/self-harm ideation and paranoia.  Denies auditory and visual hallucinations.  Denies suicidal ideations. Denies any intent, plan or access to firearms/weapons.  Patient states he only used fentanyl one time since he has been at [facility].States he was kicked out for substance use. States he plans on discharge is to return to [place] house if he is accepted back in. Discussed negative UDS.  Patient states, "I was put into a position to where I had to say I was using one half a gram of fentanyl a day, sorry I lied but I had to I had no where else to go".  States he is ready to get back to work. Reassurance, support, and encouragement provided.  Declined collateral to be called.    Stay Summary: Zachary Freeman was admitted to the [facility] unit for substance use and crisis management. Hewas treated with the following medications Zoloft, Hydroxyzine and Gabapentin which were tolerated with no adverse reactions.   Zachary Freeman was discharged with current medication and was instructed on how to take medications as prescribed. Discussed  refraining from alcohol or other substances while taking prescription medications.   Zachary Freeman's improvement was monitored and his report of symptom reduction. His emotional and mental status was also monitored by staff.  He was evaluated for stability and plans for continued recovery upon discharge.   The following was addressed as part of his discharge planning and follow up treatment:  Employment, housing, transportation, bed availability, health status, family support, and any pending legal issues were also considered during his admission. He was offered further treatment options upon discharge including but not limited to Residential, Intensive Outpatient, Outpatient treatment, Rehabilitation services, and resources for shelters and Half-way-house if needed.  Zachary Freeman will follow up with the services as listed below under Follow up Information.     Upon completion of this admission the Nas Wafer was both mentally and medically stable for discharge denying suicidal/homicidal ideation, auditory/visual/tactile hallucinations, delusional thoughts and paranoia.     Total Time spent with patient: 30 minutes  Past Psychiatric History: Hx of MDD, Opioid use dx, and Stimulant use disorder. Past Medical History: No past medical history on file. No past surgical history on file. Family History: No family history on file. Family Psychiatric History: unknown Social History:  Social History   Substance and Sexual Activity  Alcohol Use None     Social History   Substance and Sexual Activity  Drug Use Not on file    Social History   Socioeconomic History   Marital status: Single    Spouse name: Not on file   Number of children: Not on file   Years of  education: Not on file   Highest education level: Not on file  Occupational History   Not on file  Tobacco Use   Smoking status: Some Days    Packs/day: 0.50    Years: 10.00    Pack years: 5.00    Types: Cigarettes   Smokeless  tobacco: Never  Substance and Sexual Activity   Alcohol use: Not on file   Drug use: Not on file   Sexual activity: Not on file  Other Topics Concern   Not on file  Social History Narrative   Not on file   Social Determinants of Health   Financial Resource Strain: Not on file  Food Insecurity: Not on file  Transportation Needs: Not on file  Physical Activity: Not on file  Stress: Not on file  Social Connections: Not on file   SDOH:  SDOH Screenings   Alcohol Screen: Not on file  Depression (PHQ2-9): Low Risk    PHQ-2 Score: 0  Financial Resource Strain: Not on file  Food Insecurity: Not on file  Housing: Not on file  Physical Activity: Not on file  Social Connections: Not on file  Stress: Not on file  Tobacco Use: High Risk   Smoking Tobacco Use: Some Days   Smokeless Tobacco Use: Never  Transportation Needs: Not on file    Tobacco Cessation:  A prescription for an FDA-approved tobacco cessation medication was offered at discharge and the patient refused  Current Medications:  Current Facility-Administered Medications  Medication Dose Route Frequency Provider Last Rate Last Admin   acetaminophen (TYLENOL) tablet 650 mg  650 mg Oral Q6H PRN White, Patrice L, NP       alum & mag hydroxide-simeth (MAALOX/MYLANTA) 200-200-20 MG/5ML suspension 30 mL  30 mL Oral Q4H PRN White, Patrice L, NP       cloNIDine (CATAPRES) tablet 0.1 mg  0.1 mg Oral Q6H PRN White, Patrice L, NP       dicyclomine (BENTYL) tablet 20 mg  20 mg Oral Q6H PRN White, Patrice L, NP       gabapentin (NEURONTIN) capsule 100 mg  100 mg Oral TID White, Patrice L, NP   100 mg at 10/23/20 0932   hydrOXYzine (ATARAX/VISTARIL) tablet 25 mg  25 mg Oral TID PRN White, Patrice L, NP       loperamide (IMODIUM) capsule 2-4 mg  2-4 mg Oral PRN White, Patrice L, NP       magnesium hydroxide (MILK OF MAGNESIA) suspension 30 mL  30 mL Oral Daily PRN White, Patrice L, NP       methocarbamol (ROBAXIN) tablet 500 mg  500  mg Oral Q8H PRN White, Patrice L, NP       ondansetron (ZOFRAN-ODT) disintegrating tablet 4 mg  4 mg Oral Q6H PRN White, Patrice L, NP       sertraline (ZOLOFT) tablet 50 mg  50 mg Oral Daily White, Patrice L, NP   50 mg at 10/23/20 0932   traZODone (DESYREL) tablet 50 mg  50 mg Oral QHS PRN White, Patrice L, NP       Current Outpatient Medications  Medication Sig Dispense Refill   gabapentin (NEURONTIN) 100 MG capsule Take 1 capsule (100 mg total) by mouth 3 (three) times daily. 90 capsule 1   guanFACINE (INTUNIV) 1 MG TB24 ER tablet Take 1 tablet (1 mg total) by mouth in the morning. 30 tablet 1   sertraline (ZOLOFT) 100 MG tablet Take 1 tablet (100 mg total) by mouth  daily. 30 tablet 1    PTA Medications: (Not in a hospital admission)   Musculoskeletal  Strength & Muscle Tone: within normal limits Gait & Station: normal Patient leans: N/A  Psychiatric Specialty Exam  Presentation  General Appearance: Appropriate for Environment; Casual  Eye Contact:Good  Speech:Clear and Coherent; Normal Rate  Speech Volume:Normal  Handedness:Right   Mood and Affect  Mood:Euthymic  Affect:Appropriate; Congruent   Thought Process  Thought Processes:Coherent  Descriptions of Associations:Intact  Orientation:Full (Time, Place and Person)  Thought Content:Logical  Diagnosis of Schizophrenia or Schizoaffective disorder in past: No    Hallucinations:Hallucinations: None  Ideas of Reference:None  Suicidal Thoughts:Suicidal Thoughts: No  Homicidal Thoughts:Homicidal Thoughts: No   Sensorium  Memory:Immediate Good; Recent Good; Remote Good  Judgment:Good  Insight:Good   Executive Functions  Concentration:Good  Attention Span:Good  Recall:Good  Fund of Knowledge:Good  Language:Good   Psychomotor Activity  Psychomotor Activity:Psychomotor Activity: Normal   Assets  Assets:Communication Skills; Desire for Improvement; Financial Resources/Insurance; Housing;  Physical Health; Resilience; Social Support   Sleep  Sleep:Sleep: Fair Number of Hours of Sleep: 6   Nutritional Assessment (For OBS and FBC admissions only) Has the patient had a weight loss or gain of 10 pounds or more in the last 3 months?: No Has the patient had a decrease in food intake/or appetite?: No Does the patient have dental problems?: No Does the patient have eating habits or behaviors that may be indicators of an eating disorder including binging or inducing vomiting?: No Has the patient recently lost weight without trying?: No Has the patient been eating poorly because of a decreased appetite?: No Malnutrition Screening Tool Score: 0    Physical Exam  Physical Exam Vitals and nursing note reviewed.  Constitutional:      Appearance: He is well-developed.  HENT:     Head: Normocephalic.  Eyes:     General:        Right eye: No discharge.        Left eye: No discharge.     Conjunctiva/sclera: Conjunctivae normal.  Cardiovascular:     Rate and Rhythm: Normal rate.     Heart sounds: No murmur heard. Pulmonary:     Effort: Pulmonary effort is normal. No respiratory distress.     Breath sounds: Normal breath sounds.  Musculoskeletal:        General: Normal range of motion.     Cervical back: Normal range of motion.  Skin:    Coloration: Skin is not jaundiced or pale.  Neurological:     Mental Status: He is alert and oriented to person, place, and time.  Psychiatric:        Attention and Perception: Attention and perception normal.        Mood and Affect: Mood normal.        Speech: Speech normal.        Behavior: Behavior normal. Behavior is cooperative.        Thought Content: Thought content normal.        Cognition and Memory: Cognition normal.        Judgment: Judgment is impulsive.   Review of Systems  Constitutional: Negative.  Negative for chills and fever.  HENT: Negative.  Negative for hearing loss.   Eyes: Negative.   Respiratory: Negative.   Negative for cough.   Cardiovascular: Negative.  Negative for chest pain.  Musculoskeletal: Negative.   Skin: Negative.   Neurological: Negative.  Negative for dizziness and headaches.  Psychiatric/Behavioral: Negative.  Blood pressure 113/60, pulse 81, temperature 98.9 F (37.2 C), temperature source Oral, resp. rate 18, SpO2 97 %. There is no height or weight on file to calculate BMI.  Demographic Factors:  Male, Caucasian, Low socioeconomic status, and Unemployed  Loss Factors: Financial problems/change in socioeconomic status  Historical Factors: Impulsivity  Risk Reduction Factors:   Sense of responsibility to family, Religious beliefs about death, Positive social support, Positive therapeutic relationship, and Positive coping skills or problem solving skills  Continued Clinical Symptoms:  Depression:   Comorbid alcohol abuse/dependence Impulsivity Alcohol/Substance Abuse/Dependencies  Cognitive Features That Contribute To Risk:  None    Suicide Risk:  Minimal: No identifiable suicidal ideation.  Patients presenting with no risk factors but with morbid ruminations; may be classified as minimal risk based on the severity of the depressive symptoms  Plan Of Care/Follow-up recommendations:  Activity:  as tolerated  Diet:  regular  Disposition: Discharge patient.   Printed prescription provided for Zoloft 50 mg p.o. daily, hydroxyzine 25 mg p.o. 3 times daily as needed, gabapentin 100 mg p.o. 3 times daily. 30 day prescription with no refill.  Prov[facility], explained open access walk-in hours.    Social work provided outpatient services for substance abuse services.  No evidence of imminent risk to self or others at present.    Patient does not meet criteria for psychiatric inpatient admission. Discussed crisis plan, support from social network, calling  911, coming to the Emergency Department, and calling Suicide Hotline.   Ardis Hughsarolyn H Tamie Minteer, NP 10/23/2020, 9:56 AM

## 2020-12-27 ENCOUNTER — Emergency Department (HOSPITAL_COMMUNITY): Admission: EM | Admit: 2020-12-27 | Discharge: 2020-12-28 | Discharge status: Against Medical Advice | Payer: Self-pay

## 2020-12-28 ENCOUNTER — Other Ambulatory Visit (HOSPITAL_COMMUNITY)
Admission: EM | Admit: 2020-12-28 | Discharge: 2021-01-01 | Disposition: A | Discharge status: Home / Self Care | Payer: No Payment, Other | Attending: Psychiatry | Admitting: Psychiatry

## 2020-12-28 ENCOUNTER — Other Ambulatory Visit: Payer: Self-pay

## 2020-12-28 ENCOUNTER — Emergency Department (HOSPITAL_COMMUNITY)
Admission: EM | Admit: 2020-12-28 | Discharge: 2020-12-28 | Disposition: A | Discharge status: Other Acute Inpatient Hospital | Payer: 59 | Attending: Emergency Medicine | Admitting: Emergency Medicine

## 2020-12-28 DIAGNOSIS — F332 Major depressive disorder, recurrent severe without psychotic features: Secondary | ICD-10-CM | POA: Insufficient documentation

## 2020-12-28 DIAGNOSIS — F1121 Opioid dependence, in remission: Secondary | ICD-10-CM | POA: Diagnosis not present

## 2020-12-28 DIAGNOSIS — F1590 Other stimulant use, unspecified, uncomplicated: Secondary | ICD-10-CM

## 2020-12-28 DIAGNOSIS — F17210 Nicotine dependence, cigarettes, uncomplicated: Secondary | ICD-10-CM | POA: Insufficient documentation

## 2020-12-28 DIAGNOSIS — Z9151 Personal history of suicidal behavior: Secondary | ICD-10-CM | POA: Insufficient documentation

## 2020-12-28 DIAGNOSIS — R45851 Suicidal ideations: Secondary | ICD-10-CM | POA: Insufficient documentation

## 2020-12-28 DIAGNOSIS — Z5900 Homelessness unspecified: Secondary | ICD-10-CM | POA: Insufficient documentation

## 2020-12-28 DIAGNOSIS — F1910 Other psychoactive substance abuse, uncomplicated: Secondary | ICD-10-CM

## 2020-12-28 DIAGNOSIS — Z20822 Contact with and (suspected) exposure to covid-19: Secondary | ICD-10-CM | POA: Diagnosis not present

## 2020-12-28 DIAGNOSIS — F329 Major depressive disorder, single episode, unspecified: Secondary | ICD-10-CM | POA: Diagnosis not present

## 2020-12-28 DIAGNOSIS — Z79899 Other long term (current) drug therapy: Secondary | ICD-10-CM | POA: Insufficient documentation

## 2020-12-28 DIAGNOSIS — F1994 Other psychoactive substance use, unspecified with psychoactive substance-induced mood disorder: Secondary | ICD-10-CM | POA: Diagnosis not present

## 2020-12-28 DIAGNOSIS — F3342 Major depressive disorder, recurrent, in full remission: Secondary | ICD-10-CM | POA: Diagnosis present

## 2020-12-28 DIAGNOSIS — Z046 Encounter for general psychiatric examination, requested by authority: Secondary | ICD-10-CM | POA: Diagnosis present

## 2020-12-28 LAB — COMPREHENSIVE METABOLIC PANEL
ALT: 73 U/L — ABNORMAL HIGH (ref 0–44)
AST: 35 U/L (ref 15–41)
Albumin: 4.8 g/dL (ref 3.5–5.0)
Alkaline Phosphatase: 48 U/L (ref 38–126)
Anion gap: 8 (ref 5–15)
BUN: 21 mg/dL — ABNORMAL HIGH (ref 6–20)
CO2: 29 mmol/L (ref 22–32)
Calcium: 9.5 mg/dL (ref 8.9–10.3)
Chloride: 99 mmol/L (ref 98–111)
Creatinine, Ser: 0.85 mg/dL (ref 0.61–1.24)
GFR, Estimated: >60 mL/min (ref >60)
Glucose, Bld: 129 mg/dL — ABNORMAL HIGH (ref 70–99)
Potassium: 4 mmol/L (ref 3.5–5.1)
Sodium: 136 mmol/L (ref 135–145)
Total Bilirubin: 0.9 mg/dL (ref 0.3–1.2)
Total Protein: 8.2 g/dL — ABNORMAL HIGH (ref 6.5–8.1)

## 2020-12-28 LAB — CBC WITH DIFFERENTIAL/PLATELET
Abs Immature Granulocytes: 0.09 10*3/uL — ABNORMAL HIGH (ref 0.00–0.07)
Basophils Absolute: 0 10*3/uL (ref 0.0–0.1)
Basophils Relative: 0 %
Eosinophils Absolute: 0 10*3/uL (ref 0.0–0.5)
Eosinophils Relative: 0 %
HCT: 43.5 % (ref 39.0–52.0)
Hemoglobin: 14.8 g/dL (ref 13.0–17.0)
Immature Granulocytes: 1 %
Lymphocytes Relative: 11 %
Lymphs Abs: 2.1 10*3/uL (ref 0.7–4.0)
MCH: 28.6 pg (ref 26.0–34.0)
MCHC: 34 g/dL (ref 30.0–36.0)
MCV: 84 fL (ref 80.0–100.0)
Monocytes Absolute: 1.1 10*3/uL — ABNORMAL HIGH (ref 0.1–1.0)
Monocytes Relative: 6 %
Neutro Abs: 15.3 10*3/uL — ABNORMAL HIGH (ref 1.7–7.7)
Neutrophils Relative %: 82 %
Platelets: 264 10*3/uL (ref 150–400)
RBC: 5.18 MIL/uL (ref 4.22–5.81)
RDW: 12 % (ref 11.5–15.5)
WBC: 18.6 10*3/uL — ABNORMAL HIGH (ref 4.0–10.5)
nRBC: 0 % (ref 0.0–0.2)

## 2020-12-28 LAB — RAPID URINE DRUG SCREEN, HOSP PERFORMED
Amphetamines: NOT DETECTED
Barbiturates: NOT DETECTED
Benzodiazepines: POSITIVE — AB
Cocaine: NOT DETECTED
Opiates: POSITIVE — AB
Tetrahydrocannabinol: NOT DETECTED

## 2020-12-28 LAB — URINALYSIS, ROUTINE W REFLEX MICROSCOPIC
Bilirubin Urine: NEGATIVE
Glucose, UA: NEGATIVE mg/dL
Hgb urine dipstick: NEGATIVE
Ketones, ur: 5 mg/dL — AB
Nitrite: NEGATIVE
Protein, ur: NEGATIVE mg/dL
Specific Gravity, Urine: 1.026 (ref 1.005–1.030)
pH: 6 (ref 5.0–8.0)

## 2020-12-28 LAB — RESP PANEL BY RT-PCR (FLU A&B, COVID) ARPGX2
Influenza A by PCR: NEGATIVE
Influenza B by PCR: NEGATIVE
SARS Coronavirus 2 by RT PCR: NEGATIVE

## 2020-12-28 LAB — SALICYLATE LEVEL: Salicylate Lvl: <7 mg/dL — ABNORMAL LOW (ref 7.0–30.0)

## 2020-12-28 LAB — ACETAMINOPHEN LEVEL: Acetaminophen (Tylenol), Serum: <10 ug/mL — ABNORMAL LOW (ref 10–30)

## 2020-12-28 MED ORDER — ONDANSETRON HCL 4 MG PO TABS: 4.0000 mg | ORAL_TABLET | Freq: Three times a day (TID) | ORAL | Status: DC | PRN

## 2020-12-28 MED ORDER — IBUPROFEN 200 MG PO TABS: 600.0000 mg | ORAL_TABLET | Freq: Three times a day (TID) | ORAL | Status: DC | PRN

## 2020-12-28 MED ORDER — GABAPENTIN 100 MG PO CAPS
100.0000 mg | ORAL_CAPSULE | Freq: Three times a day (TID) | ORAL | Status: DC
  Administered 2020-12-29 – 2021-01-01 (×10): 100 mg via ORAL
  Filled 2020-12-28 (×2): qty 1
  Filled 2020-12-28: qty 21
  Filled 2020-12-28 (×11): qty 1

## 2020-12-28 MED ORDER — NAPROXEN 500 MG PO TABS: 500.0000 mg | ORAL_TABLET | Freq: Two times a day (BID) | ORAL | Status: DC | PRN

## 2020-12-28 MED ORDER — CLONIDINE HCL 0.1 MG PO TABS
0.1000 mg | ORAL_TABLET | Freq: Four times a day (QID) | ORAL | Status: DC
  Administered 2020-12-28 (×2): 0.1 mg via ORAL
  Filled 2020-12-28 (×2): qty 1

## 2020-12-28 MED ORDER — SERTRALINE HCL 50 MG PO TABS
50.0000 mg | ORAL_TABLET | Freq: Every day | ORAL | Status: DC
  Administered 2020-12-29 – 2020-12-31 (×3): 50 mg via ORAL
  Filled 2020-12-28 (×4): qty 1

## 2020-12-28 MED ORDER — METHOCARBAMOL 500 MG PO TABS: 500.0000 mg | ORAL_TABLET | Freq: Three times a day (TID) | ORAL | Status: DC | PRN

## 2020-12-28 MED ORDER — LOPERAMIDE HCL 2 MG PO CAPS: 2.0000 mg | ORAL_CAPSULE | ORAL | Status: DC | PRN

## 2020-12-28 MED ORDER — ALUM & MAG HYDROXIDE-SIMETH 200-200-20 MG/5ML PO SUSP: 30.0000 mL | Freq: Four times a day (QID) | ORAL | Status: DC | PRN

## 2020-12-28 MED ORDER — TRAZODONE HCL 50 MG PO TABS
50.0000 mg | ORAL_TABLET | Freq: Every evening | ORAL | Status: DC | PRN
  Administered 2020-12-28 – 2020-12-31 (×4): 50 mg via ORAL
  Filled 2020-12-28 (×4): qty 1
  Filled 2020-12-28: qty 7
  Filled 2020-12-28 (×4): qty 1

## 2020-12-28 MED ORDER — HYDROXYZINE HCL 25 MG PO TABS: 25.0000 mg | ORAL_TABLET | Freq: Four times a day (QID) | ORAL | Status: DC | PRN

## 2020-12-28 MED ORDER — DICYCLOMINE HCL 20 MG PO TABS: 20.0000 mg | ORAL_TABLET | Freq: Four times a day (QID) | ORAL | Status: DC | PRN

## 2020-12-28 MED ORDER — MAGNESIUM HYDROXIDE 400 MG/5ML PO SUSP: 30.0000 mL | Freq: Every day | ORAL | Status: DC | PRN

## 2020-12-28 MED ORDER — ALUM & MAG HYDROXIDE-SIMETH 200-200-20 MG/5ML PO SUSP: 30.0000 mL | ORAL | Status: DC | PRN

## 2020-12-28 MED ORDER — CLONIDINE HCL 0.1 MG PO TABS: 0.1000 mg | ORAL_TABLET | Freq: Two times a day (BID) | ORAL | Status: DC

## 2020-12-28 MED ORDER — ONDANSETRON 4 MG PO TBDP: 4.0000 mg | ORAL_TABLET | Freq: Four times a day (QID) | ORAL | Status: DC | PRN

## 2020-12-28 MED ORDER — CLONIDINE HCL 0.1 MG PO TABS: 0.1000 mg | ORAL_TABLET | Freq: Every day | ORAL | Status: DC

## 2020-12-28 MED ORDER — ACETAMINOPHEN 325 MG PO TABS: 650.0000 mg | ORAL_TABLET | Freq: Four times a day (QID) | ORAL | Status: DC | PRN

## 2020-12-28 MED ORDER — HYDROXYZINE HCL 25 MG PO TABS
25.0000 mg | ORAL_TABLET | Freq: Three times a day (TID) | ORAL | Status: DC | PRN
  Administered 2020-12-30 – 2021-01-01 (×5): 25 mg via ORAL
  Filled 2020-12-28 (×2): qty 1
  Filled 2020-12-28: qty 10
  Filled 2020-12-28 (×6): qty 1

## 2020-12-28 NOTE — ED Notes (Signed)
PT in his room sleeping, respirations even/unlabored, environment check complete/secure, will continue to monitor patient for safety

## 2020-12-28 NOTE — ED Notes (Signed)
Pt belonging: black sweater, black pants, black shirt, blue cell phone, pink charger

## 2020-12-28 NOTE — ED Notes (Signed)
Pt provided breakfast tray.

## 2020-12-28 NOTE — ED Provider Notes (Signed)
[facility] Admission Suicide Risk Assessment   Nursing information obtained from:   Attending RN, medical record Demographic factors:    currently homeless, substance use  Current Mental Status:   alert and oriented Loss Factors:    housing, relationships Historical Factors:    hx of one previous suicide attempt, hx inpatient psychiatric hospitalization Risk Reduction Factors:    employed, sense of responsibility, social support  Total Time spent with patient: 20 minutes Principal Problem: <principal problem not specified> Diagnosis:  Active Problems:   Substance induced mood disorder (HCC)  Subjective Data: Zachary Freeman is a 35 year old male who presents voluntarily to [facility] behavioral health. He reports he would like to be restarted on his medication and return to sober living of [place], sober living house in approximately 3 days. He was discharged from the sober living house after engaging in substance use.  He reports he was discharged 2 days ago and can return after a total of 5 days.  He is eager to return to sober living as he has employment that begins on Monday. He denies suicidal and homicidal ideations.  He denies both auditory and visual hallucinations.  There is no evidence of delusional thought content he denies symptoms of paranoia. Patient offered support and encouragement.  He remains voluntary at this time.  Continued Clinical Symptoms:    The "Alcohol Use Disorders Identification Test", Guidelines for Use in Primary Care, Second Edition.  World Science writer [facility]). Score between 0-7:  no or low risk or alcohol related problems. Score between 8-15:  moderate risk of alcohol related problems. Score between 16-19:  high risk of alcohol related problems. Score 20 or above:  warrants further diagnostic evaluation for alcohol dependence and treatment.   CLINICAL FACTORS:   Alcohol/Substance Abuse/Dependencies Previous Psychiatric Diagnoses and  Treatments   Musculoskeletal: Strength & Muscle Tone: within normal limits Gait & Station: normal Patient leans: N/A  Psychiatric Specialty Exam:  Presentation  General Appearance: Appropriate for Environment; Casual  Eye Contact:Good  Speech:Clear and Coherent; Normal Rate  Speech Volume:Normal  Handedness:Right   Mood and Affect  Mood:Euthymic  Affect:Appropriate; Congruent   Thought Process  Thought Processes:Coherent; Goal Directed; Linear  Descriptions of Associations:Intact  Orientation:Full (Time, Place and Person)  Thought Content:WDL  History of Schizophrenia/Schizoaffective disorder:No  Duration of Psychotic Symptoms:No data recorded Hallucinations:Hallucinations: None  Ideas of Reference:None  Suicidal Thoughts:Suicidal Thoughts: No  Homicidal Thoughts:Homicidal Thoughts: No   Sensorium  Memory:Immediate Good; Recent Good; Remote Good  Judgment:Good  Insight:Fair   Executive Functions  Concentration:Good  Attention Span:Good  Recall:Good  Fund of Knowledge:Good  Language:Good   Psychomotor Activity  Psychomotor Activity:Psychomotor Activity: Normal   Assets  Assets:Communication Skills; Desire for Improvement; Financial Resources/Insurance; Intimacy; Leisure Time; Physical Health; Resilience   Sleep  Sleep:Sleep: Fair    Physical Exam: Physical Exam Vitals and nursing note reviewed.  Constitutional:      Appearance: Normal appearance. He is normal weight.  HENT:     Head: Normocephalic and atraumatic.     Nose: Nose normal.  Cardiovascular:     Rate and Rhythm: Normal rate.  Pulmonary:     Effort: Pulmonary effort is normal.  Musculoskeletal:        General: Normal range of motion.     Cervical back: Normal range of motion.  Skin:    General: Skin is warm and dry.  Neurological:     Mental Status: He is alert and oriented to person, place, and time.  Psychiatric:  Attention and Perception: Attention  and perception normal.        Mood and Affect: Mood and affect normal.        Speech: Speech normal.        Behavior: Behavior normal. Behavior is cooperative.        Thought Content: Thought content normal.        Cognition and Memory: Cognition and memory normal.        Judgment: Judgment normal.   Review of Systems  Constitutional: Negative.   HENT: Negative.    Eyes: Negative.   Respiratory: Negative.    Cardiovascular: Negative.   Gastrointestinal: Negative.   Genitourinary: Negative.   Musculoskeletal: Negative.   Skin: Negative.   Neurological: Negative.   Endo/Heme/Allergies: Negative.   Psychiatric/Behavioral:  Positive for substance abuse.   Blood pressure (!) 151/92, pulse 96, temperature 98.5 F (36.9 C), temperature source Oral, resp. rate 18, SpO2 96 %. There is no height or weight on file to calculate BMI.   COGNITIVE FEATURES THAT CONTRIBUTE TO RISK:  None    SUICIDE RISK:   Minimal: No identifiable suicidal ideation.  Patients presenting with no risk factors but with morbid ruminations; may be classified as minimal risk based on the severity of the depressive symptoms  PLAN OF CARE: Patient will be admitted to facility based crisis at [facility] behavioral health for treatment and stabilization. Patient reviewed with Dr. Bronwen Betters. Current medications: -Acetaminophen 650 mg every 6 as needed/mild pain -Maalox 30 mL oral every 4 as needed/digestion -Gabapentin 100 mg 3 times daily/cravings -Hydroxyzine 25 mg 3 times daily as needed/anxiety -Magnesium hydroxide 30 mL daily as needed/mild constipation -Sertraline 50 mg daily/mood -Trazodone 50 mg nightly as needed/sleep   I certify that inpatient services furnished can reasonably be expected to improve the patient's condition.   Lenard Lance, FNP 12/28/2020, 6:33 PM

## 2020-12-28 NOTE — ED Notes (Signed)
5 bags and 1 shoe box placed in [facility] dayroom.

## 2020-12-28 NOTE — ED Provider Notes (Signed)
[facility]ENCY DEPT Provider Note   CSN: [id number] Arrival date & time: 12/28/20  0142     History Chief Complaint  Patient presents with   Suicidal    Zachary Freeman is a 35 y.o. male.  The history is provided by the patient.  Zachary Freeman is a 35 y.o. male who presents to the Emergency Department complaining of SI.  He presents to the ED voluntarily for evaluation of suicidal thoughts as well is fentanyl abuse. He was recently released from [facility] two days ago. He was released to a halfway house and states that he was kicked out after failing a drug test. He states that he uses fentanyl by smoking, snorting and shooting. Last use was around 11 PM. He brought himself into the emergency department voluntarily because he states that he doesn't want to live like this. Whenever he stops using he has suicidal thoughts with plan to overdose. He states that when he was an [facility] he did not take it seriously and really wants help at this time. No reports of recent illnesses. No fevers, vomiting, diarrhea. He has a history of [employer], no additional medical problems.    No past medical history on file.  Patient Active Problem List   Diagnosis Date Noted   Substance induced mood disorder (HCC) 10/22/2020   MDD (major depressive disorder), recurrent, in full remission (HCC) 08/29/2020   Opioid use disorder, severe, in early remission (HCC) 08/29/2020   Stimulant use disorder, in early remission 08/29/2020   Hepatitis C test positive 08/29/2020    No past surgical history on file.     No family history on file.  Social History   Tobacco Use   Smoking status: Some Days    Packs/day: 0.50    Years: 10.00    Pack years: 5.00    Types: Cigarettes   Smokeless tobacco: Never    Home Medications Prior to Admission medications   Medication Sig Start Date End Date Taking? Authorizing Provider  gabapentin (NEURONTIN) 100 MG capsule Take 1 capsule  (100 mg total) by mouth 3 (three) times daily. 10/23/20   Ardis Hughs, NP  guanFACINE (INTUNIV) 1 MG TB24 ER tablet Take 1 tablet (1 mg total) by mouth in the morning. 08/29/20   Zena Amos, MD  hydrOXYzine (ATARAX/VISTARIL) 25 MG tablet Take 1 tablet (25 mg total) by mouth 3 (three) times daily as needed for anxiety. 10/23/20   Ardis Hughs, NP  sertraline (ZOLOFT) 100 MG tablet Take 0.5 tablets (50 mg total) by mouth daily. 10/23/20   Ardis Hughs, NP    Allergies    Patient has no known allergies.  Review of Systems   Review of Systems  All other systems reviewed and are negative.  Physical Exam Updated Vital Signs BP (!) 149/92 (BP Location: Left Arm)   Pulse 100   Temp 97.9 F (36.6 C) (Oral)   Resp 18   SpO2 97%   Physical Exam Vitals and nursing note reviewed.  Constitutional:      Appearance: He is well-developed.  HENT:     Head: Normocephalic and atraumatic.  Cardiovascular:     Rate and Rhythm: Normal rate and regular rhythm.  Pulmonary:     Effort: Pulmonary effort is normal. No respiratory distress.  Musculoskeletal:        General: No tenderness.  Skin:    General: Skin is warm and dry.  Neurological:     Mental Status: He is  alert and oriented to person, place, and time.  Psychiatric:     Comments: Flat mood and affect    ED Results / Procedures / Treatments   Labs (all labs ordered are listed, but only abnormal results are displayed) Labs Reviewed  CBC WITH DIFFERENTIAL/PLATELET - Abnormal; Notable for the following components:      Result Value   WBC 18.6 (*)    Neutro Abs 15.3 (*)    Monocytes Absolute 1.1 (*)    Abs Immature Granulocytes 0.09 (*)    All other components within normal limits  COMPREHENSIVE METABOLIC PANEL - Abnormal; Notable for the following components:   Glucose, Bld 129 (*)    BUN 21 (*)    Total Protein 8.2 (*)    ALT 73 (*)    All other components within normal limits  SALICYLATE LEVEL - Abnormal;  Notable for the following components:   Salicylate Lvl <7.0 (*)    All other components within normal limits  ACETAMINOPHEN LEVEL - Abnormal; Notable for the following components:   Acetaminophen (Tylenol), Serum <10 (*)    All other components within normal limits  URINALYSIS, ROUTINE W REFLEX MICROSCOPIC - Abnormal; Notable for the following components:   APPearance HAZY (*)    Ketones, ur 5 (*)    Leukocytes,Ua TRACE (*)    Bacteria, UA RARE (*)    All other components within normal limits  RESP PANEL BY RT-PCR (FLU A&B, COVID) ARPGX2  RAPID URINE DRUG SCREEN, HOSP PERFORMED    EKG EKG Interpretation  Date/Time:  Thursday December 28 2020 03:10:17 EDT Ventricular Rate:  96 PR Interval:  148 QRS Duration: 92 QT Interval:  338 QTC Calculation: 427 R Axis:   31 Text Interpretation: Normal sinus rhythm Normal ECG Confirmed by Tilden Fossa [phone]) on 12/28/2020 3:13:54 AM  Radiology No results found.  Procedures Procedures   Medications Ordered in ED Medications - No data to display  ED Course  I have reviewed the triage vital signs and the nursing notes.  Pertinent labs & imaging results that were available during my care of the patient were reviewed by me and considered in my medical decision making (see chart for details).    MDM Rules/Calculators/A&P                          patient here for evaluation of SI, also wants help for his substance use. On evaluation he is non-toxic appearing and in no acute distress. CBC was leukocytosis but there is no clear evidence of infection based off of history and exam. He has been medically cleared for psychiatric evaluation and treatment.  Final Clinical Impression(s) / ED Diagnoses Final diagnoses:  None    Rx / DC Orders ED Discharge Orders     None        Tilden Fossa, MD 12/28/20 [phone]

## 2020-12-28 NOTE — ED Triage Notes (Signed)
Pt complains of SI x 3 days because "he has a lot going on." Pt states that he got [facility] yesterday. Pt reports wanting detox from Fentanyl. His last Fentanyl use was 1 hr ago.

## 2020-12-28 NOTE — BH Assessment (Signed)
Comprehensive Clinical Assessment (CCA) Note  12/28/2020 [name] [id number]  Disposition: TTS Completed. Discussed clinical details with Per Zachary Chamber, DNP, whom recommends   The patient demonstrates the following risk factors for suicide: Chronic risk factors for suicide include: psychiatric disorder of ajor Depressive Disorder, Recurrent, Severe, without psychotic Features, Substance Induced Mood Disorder; Substance Use Disorder. , substance use disorder, and previous suicide attempts patient states that he has tried to overdose in the past (35 yrs old) . Acute risk factors for suicide include:  substance use and non compliant  . Protective factors for this patient include: hope for the future. Considering these factors, the overall suicide risk at this point appears to be moderate  Patient is appropriate for outpatient follow up following completion of the program at the [facility].   Chief Complaint:  Chief Complaint  Patient presents with   Suicidal   Addiction Problem   Visit Diagnosis: Major Depressive Disorder, Recurrent, Severe, without psychotic Features, Substance Induced Mood Disorder; Substance Use Disorder.   Zachary Freeman is a 35 y/o male. Upon chart review: "Pt complains of SI x 3 days because "he has a lot going on." Pt states that he got kicked out of his half way house yesterday. Pt reports wanting detox from Fentanyl. His last Fentanyl use was 1 hr ago."  Clinician meet with patient via telepsych. He is calm, cooperative, and dressed in purple scrubs. When asked what brought him to the Emergency Department he states, "I came in last night because I was wanting to hurt myself", "I'm going through a stage where I'm trying to detox and when I'm in that stage I get sad, depressed, makes me think about taking my life", "If yall can keep me here that would be such a blessing so that I can get back on my medications and stop getting high".   Patient was in a residential treatment  with [facility]. However, immediately started using on the first day. States that he found some drugs in his bag as he was getting settled at the facility. He decided to use Fentanyl. The program staff found out and discharged patient immediately. He was sent to [facility].   States that he was supposed to be taking Zoloft & Visteral, which typically helps his mood. However, non-compliant with medications "because I just chose not to". Patient says that he wants to get himself back on track so that he can function properly.   He reports current suicidal ideations. Onset of suicidal ideations started 6 days ago. He has a plan to overdose. He has made a suicide attempt in the past at the age of 35 yrs old. He tried to overdose. The prior suicide attempt was triggered by "Me and my family getting into a huge fight, I was trying to prove a point to my family. Denies hx of self-mutilating behaviors.  He has a family history of a successful suicide attempt (maternal uncle). Current depressive symptoms: hopelessness, worthlessness, isolating from others, guilt, anger/irritability, fatigue. No issues with sleeping stating, "I sleep like a baby". He sleeps 4-9 hours at a time. Appetite is fair. States that he really enjoys the hospital food and looking forward to getting lunch.   Current support system is his mother, father, and girlfriend. He has a permanent residence in [place] and lives with a friend. However, does not want to return to his residence stating his roommate uses drugs. Denies a hx of abuse and/or trauma. Highest level of education is the  11th grade. He is currently unemployed. However, does have a potential job, if he gets help. 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   Patient denies homicidal ideations. No hx of aggressive and/or assaultive behaviors. He has a court date. January 31, 2021 for a traffic violation. States that he is not on probation.  Denies AVH's.   Patient has a hx of inpatient mental health treatment [facility] for suicidal ideations. Upon chart review he was admitted to [facility]/10/2004. Upon chart review patient presented to the [facility] on 10/22/2020 for suicidal ideations. States that she did not follow up with the recommendations given ([facility] and [facility]) outpatient services. States, "I decided to go to [facility] instead and never follow up".   On today's evaluation Dorse Locy is calm/cooperative, alert/oriented x 4, with pleasant affect, and does not appear to be responding to internal/external stimuli. Speech is of normal tone. He does display some stuttering throughout the assessment. Insight and judgement are poor. Impulse control is poor.   CCA Screening, Triage and Referral (STR)  Patient Reported Information How did you hear about [place]? Family/Friend (Pt's roommate brought him to [facility].)  What Is the Reason for Your Visit/Call Today? Zachary Freeman is a 35 y/o male. Upon chart review: "Pt complains of SI x 3 days because "he has a lot going on." Pt states that he got kicked out of his half way house yesterday. Pt reports wanting detox from Fentanyl. His last Fentanyl use was 1 hr ago."  Clinician meet with patient via telepsych. He is calm, cooperative, and dressed in purple scrubs. When asked what brought him to the Emergency Department he states, "I came in last night because I was wanting to hurt myself", "I'm going through a stage where I'm trying to detox and when I'm in that stage I get sad, depressed, makes me think about taking my life", "If yall can keep me here that would be such a blessing so that I can get back on my medications and stop getting high".   Patient was in a residential treatment with [facility] x[facility] day. However, immediately started using on the first day. States that he found some drugs in his bag as he was getting settled at the facility. He decided to use Fentanyl. The program  staff found out and discharged patient immediately. He was sent to [facility].   States that he was supposed to be taking Zoloft & Visteral, which typically helps his mood. However, non-compliant with medications "because I just chose not to". Patient says that he wants to get himself back on track so that he can function properly.   He reports current suicidal ideations. Onset of suicidal ideations started 6 days ago. He has a plan to overdose. He has made a suicide attempt in the past at the age of 35 yrs old. He tried to overdose. The prior suicide attempt was triggered by "Me and my family getting into a huge fight, I was trying to prove a point to my family. Denies hx of self-mutilating behaviors.  He has a family history of a successful suicide attempt (maternal uncle). Current depressive symptoms: hopelessness, worthlessness, isolating from others, guilt, anger/irritability, fatigue. No issues with sleeping stating, "I sleep like a baby". He sleeps 4-9 hours at a time. Appetite is fair. States that he really enjoys the hospital food and looking forward to getting lunch.  How Long Has This Been Causing  You Problems? 1 wk - 1 month  What Do You Feel Would Help You the Most Today? Treatment for Depression or other mood problem; Alcohol or Drug Use Treatment; Medication(s)   Have You Recently Had Any Thoughts About Hurting Yourself? Yes  Are You Planning to Commit Suicide/Harm Yourself At This time? No   Have you Recently Had Thoughts About Hurting Someone [name]? No  Are You Planning to Harm Someone at This Time? No  Explanation: No data recorded  Have You Used Any Alcohol or Drugs in the Past 24 Hours? Yes  How Long Ago Did You Use Drugs or Alcohol? No data recorded What Did You Use and How Much? Patient states that he has been using Fentanly for the past several weeks, daily, 1.5 gram per day. Last use was right before arrival to the Emergency Department .12/27/2020 @ 11:30pm   Do You Currently  Have a Therapist/Psychiatrist? No  Name of Therapist/Psychiatrist: No data recorded  Have You Been Recently Discharged From Any Office Practice or Programs? No  Explanation of Discharge From Practice/Program: No data recorded    CCA Screening Triage Referral Assessment Type of Contact: Tele-Assessment  Telemedicine Service Delivery:   Is this Initial or Reassessment? Initial Assessment  Date Telepsych consult ordered in [facility]:  12/28/20  Time Telepsych consult ordered in [facility]:  0404  Location of Assessment: [facility] ED  Provider Location: [facility] Assessment Services   Collateral Involvement: No data recorded  Does Patient Have a Court Appointed Legal Guardian? No data recorded Name and Contact of Legal Guardian: No data recorded If Minor and Not Living with Parent(s), Who has Custody? No data recorded Is CPS involved or ever been involved? Never  Is APS involved or ever been involved? Never   Patient Determined To Be At Risk for Harm To Self or Others Based on Review of Patient Reported Information or Presenting Complaint? No  Method: No data recorded Availability of Means: No data recorded Intent: No data recorded Notification Required: No data recorded Additional Information for Danger to Others Potential: No data recorded Additional Comments for Danger to Others Potential: No data recorded Are There Guns or Other Weapons in Your Home? No data recorded Types of Guns/Weapons: No data recorded Are These Weapons Safely Secured?                            No data recorded Who Could Verify You Are Able To Have These Secured: No data recorded Do You Have any Outstanding Charges, Pending Court Dates, Parole/Probation? No data recorded Contacted To Inform of Risk of Harm To Self or Others: No data recorded   Does Patient Present under Involuntary Commitment? No  IVC Papers Initial File Date: No data recorded  [place] of Residence: [place]   Patient Currently Receiving the  Following Services: -- (Patient has no service in place at this time.)   Determination of Need: Emergent (2 hours)   Options For Referral: Facility-Based Crisis; Medication Management; Chemical Dependency Intensive Outpatient Therapy (CDIOP); Inpatient Hospitalization; Outpatient Therapy; Other: Comment (substance use residential treatment.)     CCA Biopsychosocial Patient Reported Schizophrenia/Schizoaffective Diagnosis in Past: No   Strengths: "making my girlfiend smile" I like to work.  "making others laugh when they are feeling down."   Mental Health Symptoms Depression:   Change in energy/activity; Fatigue; Hopelessness; Worthlessness; Increase/decrease in appetite   Duration of Depressive symptoms:  Duration of Depressive Symptoms: Greater than two weeks  Mania:   None   Anxiety:    Worrying; Tension; Difficulty concentrating   Psychosis:   None   Duration of Psychotic symptoms:    Trauma:   None   Obsessions:   None   Compulsions:   None   Inattention:   None   Hyperactivity/Impulsivity:   None   Oppositional/Defiant Behaviors:   None   Emotional Irregularity:   None   Other Mood/Personality Symptoms:  No data recorded   Mental Status Exam Appearance and self-care  Stature:   Average   Weight:   Average weight   Clothing:   Casual   Grooming:   Normal   Cosmetic use:   None   Posture/gait:   Normal   Motor activity:   Not Remarkable   Sensorium  Attention:   Normal   Concentration:   Normal   Orientation:   X5   Recall/memory:   Normal   Affect and Mood  Affect:   Depressed; Anxious   Mood:   Depressed   Relating  Eye contact:   Normal   Facial expression:   Depressed   Attitude toward examiner:   Cooperative   Thought and Language  Speech flow:  Clear and Coherent   Thought content:   Appropriate to Mood and Circumstances   Preoccupation:   None   Hallucinations:   None   Organization:   No data recorded  [employer] of Knowledge:   Average   Intelligence:   Average   Abstraction:   Normal   Judgement:   Fair   Dance movement psychotherapist:   Adequate   Insight:   Fair   Decision Making:   Normal   Social Functioning  Social Maturity:   Self-centered   Social Judgement:   Normal   Stress  Stressors:   Work   Coping Ability:   Human resources officer Deficits:   Interpersonal   Supports:   Family; Friends/Service system     Religion: Religion/Spirituality Are You A Religious Person?: No  Leisure/Recreation: Leisure / Recreation Do You Have Hobbies?: No  Exercise/Diet: Exercise/Diet Do You Exercise?: No Have You Gained or Lost A Significant Amount of Weight in the Past Six Months?: No Do You Follow a Special Diet?: No Do You Have Any Trouble Sleeping?: Yes Explanation of Sleeping Difficulties: Using opiates to help him sleep.   CCA Employment/Education Employment/Work Situation: Employment / Work Situation Employment Situation: Unemployed Patient's Job has Been Impacted by Current Illness:  (He is currently unemployed. However, does have a potential job, if he gets help. 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.) Has Patient ever Been in Equities trader?: No  Education: Education Is Patient Currently Attending School?: No Last Grade Completed: 11 Did You Product manager?: No Did You Have An Individualized Education Program (IIEP): No Did You Have Any Difficulty At School?: No Patient's Education Has Been Impacted by Current Illness: No   CCA Family/Childhood History Family and Relationship History: Family history Marital status: Single Does patient have children?: No  Childhood History:  Childhood History By whom was/is the patient raised?: Both parents Did patient suffer any verbal/emotional/physical/sexual abuse as a child?: No Did patient suffer from severe childhood neglect?:  No Has patient ever been sexually abused/assaulted/raped as an adolescent or adult?: No Was the patient ever a victim of a crime or a disaster?: No Witnessed domestic violence?: No Has patient been affected by domestic violence  as an adult?: No  Child/Adolescent Assessment:     CCA Substance Use Alcohol/Drug Use: Alcohol / Drug Use Pain Medications: Pt abusing fentanyl. Prescriptions: Used to take Zoloft, gabapentin Over the Counter: SEEMAR= History of alcohol / drug use?: Yes Longest period of sobriety (when/how long): 1 year Negative Consequences of Use: Financial, Work / Programmer, multimedia, Personal relationships Withdrawal Symptoms: Patient aware of relationship between substance abuse and physical/medical complications, Sweats, Diarrhea, Fever / Chills, Tremors, Weakness, Change in blood pressure Substance #1 Name of Substance 1: Fentanyl 1 - Age of First Use: 35 years old 1 - Amount (size/oz): 1. 5 gram per day 1 - Frequency: daily for several weeks 1 - Duration: on-going 1 - Last Use / Amount: 10/26: "Right before I got to the hospital around 1130pm" 1 - Method of Aquiring: "I guy buy it" 1- Route of Use: "Any way possible" Substance #2 Name of Substance 2: Herion 2 - Age of First Use: 35 yrs old 2 - Amount (size/oz): 1 gram 2 - Frequency: daily 2 - Duration: on-going 2 - Last Use / Amount: 2 yrs ago 2 - Method of Aquiring: "I go buy it" 2 - Route of Substance Use: "Any way possible" Substance #3 Name of Substance 3: Opiates; "Any kind of pain pill I could find" 3 - Age of First Use: 35 yrs old after breaking my arm in High School" 3 - Amount (size/oz): varies 3 - Frequency: daily 3 - Duration: on-going 3 - Last Use / Amount: "Several years ago" 3 - Method of Aquiring: from friends 3 - Route of Substance Use: oral                   ASAM's:  Six Dimensions of Multidimensional Assessment  Dimension 1:  Acute Intoxication and/or Withdrawal Potential:       Dimension 2:  Biomedical Conditions and Complications:      Dimension 3:  Emotional, Behavioral, or Cognitive Conditions and Complications:     Dimension 4:  Readiness to Change:     Dimension 5:  Relapse, Continued use, or Continued Problem Potential:     Dimension 6:  Recovery/Living Environment:     ASAM Severity Score:    ASAM Recommended Level of Treatment:     Substance use Disorder (SUD) Substance Use Disorder (SUD)  Checklist Symptoms of Substance Use: Continued use despite having a persistent/recurrent physical/psychological problem caused/exacerbated by use, Continued use despite persistent or recurrent social, interpersonal problems, caused or exacerbated by use, Evidence of tolerance, Evidence of withdrawal (Comment), Large amounts of time spent to obtain, use or recover from the substance(s), Persistent desire or unsuccessful efforts to cut down or control use, Presence of craving or strong urge to use, Recurrent use that results in a failure to fulfill major role obligations (work, school, home), Repeated use in physically hazardous situations, Social, occupational, recreational activities given up or reduced due to use, Substance(s) often taken in larger amounts or over longer times than was intended  Recommendations for Services/Supports/Treatments: Recommendations for Services/Supports/Treatments Recommendations For Services/Supports/Treatments: Detox, Individual Therapy, CD-IOP Intensive Chemical Dependency Program, Medication Management, Peer Support, Residential-Level 1, SAIOP (Substance Abuse Intensive Outpatient Program)  Discharge Disposition:    DSM5 Diagnoses: Patient Active Problem List   Diagnosis Date Noted   Substance induced mood disorder (HCC) 10/22/2020   MDD (major depressive disorder), recurrent, in full remission (HCC) 08/29/2020   Opioid use disorder, severe, in early remission (HCC) 08/29/2020   Stimulant use disorder, in early remission 08/29/2020  Hepatitis C test positive 08/29/2020     Referrals to Alternative Service(s): Referred to Alternative Service(s):   Place:   Date:   Time:    Referred to Alternative Service(s):   Place:   Date:   Time:    Referred to Alternative Service(s):   Place:   Date:   Time:    Referred to Alternative Service(s):   Place:   Date:   Time:     Melynda Ripple, Counselor

## 2020-12-28 NOTE — ED Notes (Signed)
Pt is currently sleeping, respirations are even/unlabored, environment check complete/secure, will continue to monitor patient for safety 

## 2020-12-28 NOTE — BH Assessment (Addendum)
@  1313, requested nursing (Meghan, RN) to place the TTS machine in patient's room for his initial assessment.   @1318 , called TTS cart, no answer.

## 2020-12-28 NOTE — BH Assessment (Signed)
[facility] Assessment Progress Note   Per Caryn Bee, NP this pt would benefit from admission to Facility Based Crisis.  Earlene Plater, MD has agreed to accept.  Pt has signed Voluntary Admission and Consent for Treatment, as well as Consent to Release Information to no one, and signed forms have been faxed to [facility].  EDP Mancel Bale, MD and pt's nurse, Zachary Freeman, have been notified, and Zachary Freeman agrees to send original paperwork along with pt via [facility] [employer], and to call report to [phone].   Zachary Freeman, [place] [facility] Coordinator [phone]

## 2020-12-28 NOTE — ED Provider Notes (Signed)
Behavioral Health Admission [facility]&P [facility] & OBS)  Date: 12/28/20 Patient Name: Zachary Freeman MRN: [id number] Chief Complaint: No chief complaint on file.     Diagnoses:  Final diagnoses:  Substance induced mood disorder (HCC)  Opioid use disorder, severe, in early remission [facility])    HPI: Zachary Freeman is a 35 year old male.  Patient presents voluntarily to [facility] behavioral health. He states "I would like to get restarted on my medicines and I would like to return to his sober living of [place] house by Monday, I have a new job starting on Monday."  Gilliam reports recent stressors include finding fentanyl in his belongings bag while at the sober living facility.  He was subsequently discharged after it was discovered he engaged in substance use.  He last used fentanyl on yesterday and last used benzodiazepine medications, not prescribed for him, approximately 6 days ago.  He is committed to his sobriety and looks forward to returning to sober living house to continue his recovery.  Patient is assessed face-to-face by nurse practitioner.  He is seated, no acute distress.  He is alert and oriented, pleasant and cooperative during assessment.  He presents with euthymic mood, congruent affect. He denies suicidal and homicidal ideations.  He endorses history of 1 prior suicide attempt approximately 2 years ago.  He does not share details surrounding this attempt.  Zachary Freeman denies history of self-harm.  He contracts verbally for safety with this Clinical research associate.  He has normal speech and behavior.  He denies both auditory and visual hallucinations.  Patient is able to converse coherently with goal-directed thoughts and no distractibility or preoccupation.  He denies paranoia.  Objectively there is no evidence of psychosis/mania or delusional thinking.  Zachary Freeman has been diagnosed with substance-induced mood disorder as well as major depressive disorder.  He reports plan to follow-up with outpatient  psychiatry at [facility] behavioral health.  He reports compliance with medications including Zoloft, he reports he last had his Zoloft on yesterday.  He is currently homeless while discharged from sober living house.  He reports he was discharged approximately 2 days ago and will be permitted to return after a total of 5 days from the date of his initial discharge.  He endorses average sleep and appetite.  [name] is a nicotine user, daily.  He is offered nicotine replacement patch, declines at this time.   PHQ 2-9:   Flowsheet Row ED from 12/28/2020 in [facility] Most recent reading at 12/28/2020  6:08 PM ED from 12/28/2020 in [facility] Most recent reading at 12/28/2020  2:11 AM ED from 10/22/2020 in [facility] Most recent reading at 10/22/2020  8:50 AM  C-SSRS RISK CATEGORY High Risk High Risk Low Risk        Total Time spent with patient: 20 minutes  Musculoskeletal  Strength & Muscle Tone: within normal limits Gait & Station: normal Patient leans: N/A  Psychiatric Specialty Exam  Presentation General Appearance: Appropriate for Environment; Casual  Eye Contact:Good  Speech:Clear and Coherent; Normal Rate  Speech Volume:Normal  Handedness:Right   Mood and Affect  Mood:Euthymic  Affect:Appropriate; Congruent   Thought Process  Thought Processes:Coherent; Goal Directed; Linear  Descriptions of Associations:Intact  Orientation:Full (Time, Place and Person)  Thought Content:WDL  Diagnosis of Schizophrenia or Schizoaffective disorder in past: No   Hallucinations:Hallucinations: None  Ideas of Reference:None  Suicidal Thoughts:Suicidal Thoughts: No  Homicidal Thoughts:Homicidal Thoughts: No   Sensorium  Memory:Immediate Good; Recent Good;  Remote Good  Judgment:Good  Insight:Fair   Executive Functions  Concentration:Good  Attention  Span:Good  Recall:Good  Fund of Knowledge:Good  Language:Good   Psychomotor Activity  Psychomotor Activity:Psychomotor Activity: Normal   Assets  Assets:Communication Skills; Desire for Improvement; Financial Resources/Insurance; Intimacy; Leisure Time; Physical Health; Resilience   Sleep  Sleep:Sleep: Fair   Nutritional Assessment (For OBS and FBC admissions only) Has the patient had a weight loss or gain of 10 pounds or more in the last 3 months?: No Has the patient had a decrease in food intake/or appetite?: No Does the patient have dental problems?: No Does the patient have eating habits or behaviors that may be indicators of an eating disorder including binging or inducing vomiting?: No Has the patient recently lost weight without trying?: 0 Has the patient been eating poorly because of a decreased appetite?: 0 Malnutrition Screening Tool Score: 0   Physical Exam Vitals and nursing note reviewed.  Constitutional:      Appearance: Normal appearance. He is well-developed and normal weight.  HENT:     Head: Normocephalic and atraumatic.     Nose: Nose normal.  Cardiovascular:     Rate and Rhythm: Normal rate.  Pulmonary:     Effort: Pulmonary effort is normal.  Musculoskeletal:        General: Normal range of motion.  Skin:    General: Skin is warm and dry.  Neurological:     Mental Status: He is alert and oriented to person, place, and time.  Psychiatric:        Attention and Perception: Attention and perception normal.        Mood and Affect: Mood and affect normal.        Speech: Speech normal.        Behavior: Behavior normal. Behavior is cooperative.        Thought Content: Thought content normal.        Cognition and Memory: Cognition and memory normal.        Judgment: Judgment normal.   Review of Systems  Constitutional: Negative.   HENT: Negative.    Eyes: Negative.   Respiratory: Negative.    Cardiovascular: Negative.   Gastrointestinal:  Negative.   Genitourinary: Negative.   Musculoskeletal: Negative.   Skin: Negative.   Neurological: Negative.   Endo/Heme/Allergies: Negative.   Psychiatric/Behavioral:  Positive for substance abuse.    Blood pressure (!) 151/92, pulse 96, temperature 98.5 F (36.9 C), temperature source Oral, resp. rate 18, SpO2 96 %. There is no height or weight on file to calculate BMI.  Past Psychiatric History: Substance-induced mood disorder, stimulant use disorder, major depressive disorder, opioid use disorder  Is the patient at risk to self? No  Has the patient been a risk to self in the past 6 months? No .    Has the patient been a risk to self within the distant past? Yes   Is the patient a risk to others? No   Has the patient been a risk to others in the past 6 months? No   Has the patient been a risk to others within the distant past? No   Past Medical History: No past medical history on file. No past surgical history on file.  Family History: No family history on file.  Social History:  Social History   Socioeconomic History   Marital status: Single    Spouse name: Not on file   Number of children: Not on file   Years of education:  Not on file   Highest education level: Not on file  Occupational History   Not on file  Tobacco Use   Smoking status: Some Days    Packs/day: 0.50    Years: 10.00    Pack years: 5.00    Types: Cigarettes   Smokeless tobacco: Never  Substance and Sexual Activity   Alcohol use: Not on file   Drug use: Not on file   Sexual activity: Not on file  Other Topics Concern   Not on file  Social History Narrative   Not on file   Social Determinants of Health   Financial Resource Strain: Not on file  Food Insecurity: Not on file  Transportation Needs: Not on file  Physical Activity: Not on file  Stress: Not on file  Social Connections: Not on file  Intimate Partner Violence: Not on file    SDOH:  SDOH Screenings   Alcohol Screen: Not on  file  Depression (PHQ2-9): Low Risk    PHQ-2 Score: 0  Financial Resource Strain: Not on file  Food Insecurity: Not on file  Housing: Not on file  Physical Activity: Not on file  Social Connections: Not on file  Stress: Not on file  Tobacco Use: High Risk   Smoking Tobacco Use: Some Days   Smokeless Tobacco Use: Never   Passive Exposure: Not on file  Transportation Needs: Not on file    Last Labs:  Admission on 12/28/2020, Discharged on 12/28/2020  Component Date Value Ref Range Status   WBC 12/28/2020 18.6 (A)  4.0 - 10.5 K/uL Final   RBC 12/28/2020 5.18  4.22 - 5.81 MIL/uL Final   Hemoglobin 12/28/2020 14.8  13.0 - 17.0 g/dL Final   HCT 96/06/5407 43.5  39.0 - 52.0 % Final   MCV 12/28/2020 84.0  80.0 - 100.0 fL Final   MCH 12/28/2020 28.6  26.0 - 34.0 pg Final   MCHC 12/28/2020 34.0  30.0 - 36.0 g/dL Final   RDW 81/19/1478 12.0  11.5 - 15.5 % Final   Platelets 12/28/2020 264  150 - 400 K/uL Final   nRBC 12/28/2020 0.0  0.0 - 0.2 % Final   Neutrophils Relative % 12/28/2020 82  % Final   Neutro Abs 12/28/2020 15.3 (A)  1.7 - 7.7 K/uL Final   Lymphocytes Relative 12/28/2020 11  % Final   Lymphs Abs 12/28/2020 2.1  0.7 - 4.0 K/uL Final   Monocytes Relative 12/28/2020 6  % Final   Monocytes Absolute 12/28/2020 1.1 (A)  0.1 - 1.0 K/uL Final   Eosinophils Relative 12/28/2020 0  % Final   Eosinophils Absolute 12/28/2020 0.0  0.0 - 0.5 K/uL Final   Basophils Relative 12/28/2020 0  % Final   Basophils Absolute 12/28/2020 0.0  0.0 - 0.1 K/uL Final   Immature Granulocytes 12/28/2020 1  % Final   Abs Immature Granulocytes 12/28/2020 0.09 (A)  0.00 - 0.07 K/uL Final   Performed at [facility], [address] [place], [place] [address]   Sodium 12/28/2020 136  135 - 145 mmol/L Final   Potassium 12/28/2020 4.0  3.5 - 5.1 mmol/L Final   Chloride 12/28/2020 99  98 - 111 mmol/L Final   CO2 12/28/2020 29  22 - 32 mmol/L Final   Glucose, Bld 12/28/2020 129 (A)  70 - 99  mg/dL Final   Glucose reference range applies only to samples taken after fasting for at least 8 hours.   BUN 12/28/2020 21 (A)  6 -  20 mg/dL Final   Creatinine, Ser 12/28/2020 0.85  0.61 - 1.24 mg/dL Final   Calcium 16/12/9602 9.5  8.9 - 10.3 mg/dL Final   Total Protein 54/11/8117 8.2 (A)  6.5 - 8.1 g/dL Final   Albumin 14/78/2956 4.8  3.5 - 5.0 g/dL Final   AST 21/30/8657 35  15 - 41 U/L Final   ALT 12/28/2020 73 (A)  0 - 44 U/L Final   Alkaline Phosphatase 12/28/2020 48  38 - 126 U/L Final   Total Bilirubin 12/28/2020 0.9  0.3 - 1.2 mg/dL Final   GFR, Estimated 12/28/2020 >60  >60 mL/min Final   Comment: (NOTE) Calculated using the CKD-EPI Creatinine Equation (2021)    Anion gap 12/28/2020 8  5 - 15 Final   Performed at [facility], [address] [place], [place] [address]   Salicylate Lvl 12/28/2020 <7.0 (A)  7.0 - 30.0 mg/dL Final   Performed at [facility], [address] [place], [place] [address]   Acetaminophen (Tylenol), Serum 12/28/2020 <10 (A)  10 - 30 ug/mL Final   Comment: (NOTE) Therapeutic concentrations vary significantly. A range of 10-30 ug/mL  may be an effective concentration for many patients. However, some  are best treated at concentrations outside of this range. Acetaminophen concentrations >150 ug/mL at 4 hours after ingestion  and >50 ug/mL at 12 hours after ingestion are often associated with  toxic reactions.  Performed at [facility], [address], [place], [place] [address]    Color, Urine 12/28/2020 YELLOW  YELLOW Final   APPearance 12/28/2020 HAZY (A)  CLEAR Final   Specific Gravity, Urine 12/28/2020 1.026  1.005 - 1.030 Final   pH 12/28/2020 6.0  5.0 - 8.0 Final   Glucose, UA 12/28/2020 NEGATIVE  NEGATIVE mg/dL Final   Hgb urine dipstick 12/28/2020 NEGATIVE  NEGATIVE Final   Bilirubin Urine 12/28/2020 NEGATIVE  NEGATIVE Final   Ketones, ur 12/28/2020 5 (A)  NEGATIVE mg/dL Final    Protein, ur 40/12/2723 NEGATIVE  NEGATIVE mg/dL Final   Nitrite 36/64/4034 NEGATIVE  NEGATIVE Final   Leukocytes,Ua 12/28/2020 TRACE (A)  NEGATIVE Final   RBC / HPF 12/28/2020 0-5  0 - 5 RBC/hpf Final   WBC, UA 12/28/2020 21-50  0 - 5 WBC/hpf Final   Bacteria, UA 12/28/2020 RARE (A)  NONE SEEN Final   Squamous Epithelial / LPF 12/28/2020 0-5  0 - 5 Final   Mucus 12/28/2020 PRESENT   Final   Performed at [facility], [address], [place], [place] [address]   Opiates 12/28/2020 POSITIVE (A)  NONE DETECTED Final   Cocaine 12/28/2020 NONE DETECTED  NONE DETECTED Final   Benzodiazepines 12/28/2020 POSITIVE (A)  NONE DETECTED Final   Amphetamines 12/28/2020 NONE DETECTED  NONE DETECTED Final   Tetrahydrocannabinol 12/28/2020 NONE DETECTED  NONE DETECTED Final   Barbiturates 12/28/2020 NONE DETECTED  NONE DETECTED Final   Comment: (NOTE) DRUG SCREEN FOR MEDICAL PURPOSES ONLY.  IF CONFIRMATION IS NEEDED FOR ANY PURPOSE, NOTIFY LAB WITHIN 5 DAYS.  LOWEST DETECTABLE LIMITS FOR URINE DRUG SCREEN Drug Class                     Cutoff (ng/mL) Amphetamine and metabolites    1000 Barbiturate and metabolites    200 Benzodiazepine                 200 Tricyclics and metabolites     300 Opiates and metabolites  300 Cocaine and metabolites        300 THC                            50 Performed at [facility], [address] [place], [place] [address]    SARS Coronavirus 2 by RT PCR 12/28/2020 NEGATIVE  NEGATIVE Final   Comment: (NOTE) SARS-CoV-2 target nucleic acids are NOT DETECTED.  The SARS-CoV-2 RNA is generally detectable in upper respiratory specimens during the acute phase of infection. The lowest concentration of SARS-CoV-2 viral copies this assay can detect is 138 copies/mL. A negative result does not preclude SARS-Cov-2 infection and should not be used as the sole basis for treatment or other patient management decisions. A negative  result may occur with  improper specimen collection/handling, submission of specimen other than nasopharyngeal swab, presence of viral mutation(s) within the areas targeted by this assay, and inadequate number of viral copies(<138 copies/mL). A negative result must be combined with clinical observations, patient history, and epidemiological information. The expected result is Negative.  Fact Sheet for Patients:  BloggerCourse.com  Fact Sheet for Healthcare Providers:  SeriousBroker.it  This test is no                          t yet approved or cleared by the [place] FDA and  has been authorized for detection and/or diagnosis of SARS-CoV-2 by FDA under an Emergency Use Authorization (EUA). This EUA will remain  in effect (meaning this test can be used) for the duration of the COVID-19 declaration under Section 564(b)(1) of the Act, 21 U.S.C.section 360bbb-3(b)(1), unless the authorization is terminated  or revoked sooner.       Influenza A by PCR 12/28/2020 NEGATIVE  NEGATIVE Final   Influenza B by PCR 12/28/2020 NEGATIVE  NEGATIVE Final   Comment: (NOTE) The Xpert Xpress SARS-CoV-2/FLU/RSV plus assay is intended as an aid in the diagnosis of influenza from Nasopharyngeal swab specimens and should not be used as a sole basis for treatment. Nasal washings and aspirates are unacceptable for Xpert Xpress SARS-CoV-2/FLU/RSV testing.  Fact Sheet for Patients: BloggerCourse.com  Fact Sheet for Healthcare Providers: SeriousBroker.it  This test is not yet approved or cleared by the [place] FDA and has been authorized for detection and/or diagnosis of SARS-CoV-2 by FDA under an Emergency Use Authorization (EUA). This EUA will remain in effect (meaning this test can be used) for the duration of the COVID-19 declaration under Section 564(b)(1) of the Act, 21 U.S.C. section  360bbb-3(b)(1), unless the authorization is terminated or revoked.  Performed at [facility], [address] [place], [place] [address]   Admission on 10/22/2020, Discharged on 10/23/2020  Component Date Value Ref Range Status   Hgb A1c MFr Bld 10/22/2020 5.4  4.8 - 5.6 % Final   Comment: (NOTE) Pre diabetes:          5.7%-6.4%  Diabetes:              >6.4%  Glycemic control for   <7.0% adults with diabetes    Mean Plasma Glucose 10/22/2020 108.28  mg/dL Final   Performed at [facility], [address] [place], [place] [address]   Cholesterol 10/22/2020 180  0 - 200 mg/dL Final   Triglycerides 27/08/2374 57  <150 mg/dL Final   HDL 28/31/5176 48  >40 mg/dL Final   Total CHOL/HDL Ratio  10/22/2020 3.8  RATIO Final   VLDL 10/22/2020 11  0 - 40 mg/dL Final   LDL Cholesterol 10/22/2020 121 (A)  0 - 99 mg/dL Final   Comment:        Total Cholesterol/HDL:CHD Risk Coronary Heart Disease Risk Table                     Men   Women  1/2 Average Risk   3.4   3.3  Average Risk       5.0   4.4  2 X Average Risk   9.6   7.1  3 X Average Risk  23.4   11.0        Use the calculated Patient Ratio above and the CHD Risk Table to determine the patient's CHD Risk.        ATP III CLASSIFICATION (LDL):  <100     mg/dL   Optimal  644-034  mg/dL   Near or Above                    Optimal  130-159  mg/dL   Borderline  742-595  mg/dL   High  >638     mg/dL   Very High Performed at [facility], [address], [place], [place] [address]    TSH 10/22/2020 0.610  0.350 - 4.500 uIU/mL Final   Comment: Performed by a 3rd Generation assay with a functional sensitivity of <=0.01 uIU/mL. Performed at [facility], [address] [place], [place] [address]   Admission on 10/22/2020, Discharged on 10/22/2020  Component Date Value Ref Range Status   Sodium 10/22/2020 136  135 - 145 mmol/L Final   Potassium 10/22/2020 3.9  3.5 - 5.1 mmol/L Final    Chloride 10/22/2020 103  98 - 111 mmol/L Final   CO2 10/22/2020 25  22 - 32 mmol/L Final   Glucose, Bld 10/22/2020 109 (A)  70 - 99 mg/dL Final   Glucose reference range applies only to samples taken after fasting for at least 8 hours.   BUN 10/22/2020 17  6 - 20 mg/dL Final   Creatinine, Ser 10/22/2020 0.83  0.61 - 1.24 mg/dL Final   Calcium 88/41/6606 9.1  8.9 - 10.3 mg/dL Final   Total Protein 30/16/0109 7.6  6.5 - 8.1 g/dL Final   Albumin 32/35/5732 4.6  3.5 - 5.0 g/dL Final   AST 20/25/4270 25  15 - 41 U/L Final   ALT 10/22/2020 34  0 - 44 U/L Final   Alkaline Phosphatase 10/22/2020 43  38 - 126 U/L Final   Total Bilirubin 10/22/2020 1.1  0.3 - 1.2 mg/dL Final   GFR, Estimated 10/22/2020 >60  >60 mL/min Final   Comment: (NOTE) Calculated using the CKD-EPI Creatinine Equation (2021)    Anion gap 10/22/2020 8  5 - 15 Final   Performed at [facility], [address], [place], [place] [address]   Acetaminophen (Tylenol), Serum 10/22/2020 <10 (A)  10 - 30 ug/mL Final   Comment: (NOTE) Therapeutic concentrations vary significantly. A range of 10-30 ug/mL  may be an effective concentration for many patients. However, some  are best treated at concentrations outside of this range. Acetaminophen concentrations >150 ug/mL at 4 hours after ingestion  and >50 ug/mL at 12 hours after ingestion are often associated with  toxic reactions.  Performed at [facility], [address], [place], [place] [address]    Alcohol,  Ethyl (B) 10/22/2020 <10  <10 mg/dL Final   Comment: (NOTE) Lowest detectable limit for serum alcohol is 10 mg/dL.  For medical purposes only. Performed at [facility], [address] [place], [place] [address]    Salicylate Lvl 10/22/2020 <7.0 (A)  7.0 - 30.0 mg/dL Final   Performed at [facility], [address] [place], [place] [address]   WBC 10/22/2020 18.2 (A)  4.0 - 10.5 K/uL  Final   RBC 10/22/2020 4.52  4.22 - 5.81 MIL/uL Final   Hemoglobin 10/22/2020 13.2  13.0 - 17.0 g/dL Final   HCT 09/81/1914 38.5 (A)  39.0 - 52.0 % Final   MCV 10/22/2020 85.2  80.0 - 100.0 fL Final   MCH 10/22/2020 29.2  26.0 - 34.0 pg Final   MCHC 10/22/2020 34.3  30.0 - 36.0 g/dL Final   RDW 78/29/5621 12.9  11.5 - 15.5 % Final   Platelets 10/22/2020 226  150 - 400 K/uL Final   nRBC 10/22/2020 0.0  0.0 - 0.2 % Final   Neutrophils Relative % 10/22/2020 84  % Final   Neutro Abs 10/22/2020 15.3 (A)  1.7 - 7.7 K/uL Final   Lymphocytes Relative 10/22/2020 9  % Final   Lymphs Abs 10/22/2020 1.6  0.7 - 4.0 K/uL Final   Monocytes Relative 10/22/2020 7  % Final   Monocytes Absolute 10/22/2020 1.2 (A)  0.1 - 1.0 K/uL Final   Eosinophils Relative 10/22/2020 0  % Final   Eosinophils Absolute 10/22/2020 0.0  0.0 - 0.5 K/uL Final   Basophils Relative 10/22/2020 0  % Final   Basophils Absolute 10/22/2020 0.0  0.0 - 0.1 K/uL Final   Immature Granulocytes 10/22/2020 0  % Final   Abs Immature Granulocytes 10/22/2020 0.07  0.00 - 0.07 K/uL Final   Performed at [facility], [address], [place], [place] [address]   Opiates 10/22/2020 NONE DETECTED  NONE DETECTED Final   Cocaine 10/22/2020 NONE DETECTED  NONE DETECTED Final   Benzodiazepines 10/22/2020 NONE DETECTED  NONE DETECTED Final   Amphetamines 10/22/2020 NONE DETECTED  NONE DETECTED Final   Tetrahydrocannabinol 10/22/2020 NONE DETECTED  NONE DETECTED Final   Barbiturates 10/22/2020 NONE DETECTED  NONE DETECTED Final   Comment: (NOTE) DRUG SCREEN FOR MEDICAL PURPOSES ONLY.  IF CONFIRMATION IS NEEDED FOR ANY PURPOSE, NOTIFY LAB WITHIN 5 DAYS.  LOWEST DETECTABLE LIMITS FOR URINE DRUG SCREEN Drug Class                     Cutoff (ng/mL) Amphetamine and metabolites    1000 Barbiturate and metabolites    200 Benzodiazepine                 200 Tricyclics and metabolites     300 Opiates and metabolites         300 Cocaine and metabolites        300 THC                            50 Performed at [facility], [address], [place], [place] [address]    SARS Coronavirus 2 by RT PCR 10/22/2020 NEGATIVE  NEGATIVE Final   Comment: (NOTE) SARS-CoV-2 target nucleic acids are NOT DETECTED.  The SARS-CoV-2 RNA is generally detectable in upper respiratory specimens during the acute phase of infection. The lowest concentration of SARS-CoV-2 viral copies this assay can detect is  138 copies/mL. A negative result does not preclude SARS-Cov-2 infection and should not be used as the sole basis for treatment or other patient management decisions. A negative result may occur with  improper specimen collection/handling, submission of specimen other than nasopharyngeal swab, presence of viral mutation(s) within the areas targeted by this assay, and inadequate number of viral copies(<138 copies/mL). A negative result must be combined with clinical observations, patient history, and epidemiological information. The expected result is Negative.  Fact Sheet for Patients:  BloggerCourse.com  Fact Sheet for Healthcare Providers:  SeriousBroker.it  This test is no                          t yet approved or cleared by the [place] FDA and  has been authorized for detection and/or diagnosis of SARS-CoV-2 by FDA under an Emergency Use Authorization (EUA). This EUA will remain  in effect (meaning this test can be used) for the duration of the COVID-19 declaration under Section 564(b)(1) of the Act, 21 U.S.C.section 360bbb-3(b)(1), unless the authorization is terminated  or revoked sooner.       Influenza A by PCR 10/22/2020 NEGATIVE  NEGATIVE Final   Influenza B by PCR 10/22/2020 NEGATIVE  NEGATIVE Final   Comment: (NOTE) The Xpert Xpress SARS-CoV-2/FLU/RSV plus assay is intended as an aid in the diagnosis of influenza from  Nasopharyngeal swab specimens and should not be used as a sole basis for treatment. Nasal washings and aspirates are unacceptable for Xpert Xpress SARS-CoV-2/FLU/RSV testing.  Fact Sheet for Patients: BloggerCourse.com  Fact Sheet for Healthcare Providers: SeriousBroker.it  This test is not yet approved or cleared by the [place] FDA and has been authorized for detection and/or diagnosis of SARS-CoV-2 by FDA under an Emergency Use Authorization (EUA). This EUA will remain in effect (meaning this test can be used) for the duration of the COVID-19 declaration under Section 564(b)(1) of the Act, 21 U.S.C. section 360bbb-3(b)(1), unless the authorization is terminated or revoked.  Performed at [facility], [address] [place], [place] [address]     Allergies: Patient has no known allergies.  PTA Medications: (Not in a hospital admission)   Medical Decision Making  PLAN OF CARE: Patient will be admitted to facility based crisis at [facility] behavioral [facility] for treatment and stabilization. Patient reviewed with Dr. Bronwen Betters. Patient remains voluntary at this time. Current medications: -Acetaminophen 650 mg every 6 as needed/mild pain -Maalox 30 mL oral every 4 as needed/digestion -Gabapentin 100 mg 3 times daily/cravings -Hydroxyzine 25 mg 3 times daily as needed/anxiety -Magnesium hydroxide 30 mL daily as needed/mild constipation -Sertraline 50 mg daily/mood -Trazodone 50 mg nightly as needed/sleep       Recommendations  Based on my evaluation the patient does not appear to have an emergency medical condition. Laboratory studies reviewed, WBC elevated, measures 18.6 on 12/28/2020. Patient medically cleared with no clear evidence of infection. QT/QTcB measures 338/427 on 12/28/2020.  Urine drug screen positive for benzodiazepine and opiate.  Lenard Lance, FNP 12/28/20  6:41 PM

## 2020-12-28 NOTE — ED Notes (Signed)
Patient was admitted to unit without distress or complaint.  Cooperative with admission process.  Patient oriented to unit and unit process.  Denied avh shi or plan.  Verbalized desire to stop using fentanyl.  Encouraged him to seek out staff if overwhelmed by thoughts or feelings.  Will monitor and provide support as needed.

## 2020-12-29 DIAGNOSIS — F329 Major depressive disorder, single episode, unspecified: Secondary | ICD-10-CM | POA: Diagnosis not present

## 2020-12-29 DIAGNOSIS — F1994 Other psychoactive substance use, unspecified with psychoactive substance-induced mood disorder: Secondary | ICD-10-CM | POA: Diagnosis not present

## 2020-12-29 DIAGNOSIS — F1121 Opioid dependence, in remission: Secondary | ICD-10-CM | POA: Diagnosis not present

## 2020-12-29 DIAGNOSIS — Z9151 Personal history of suicidal behavior: Secondary | ICD-10-CM | POA: Diagnosis not present

## 2020-12-29 MED ORDER — DICYCLOMINE HCL 20 MG PO TABS: 20.0000 mg | ORAL_TABLET | Freq: Four times a day (QID) | ORAL | Status: DC | PRN

## 2020-12-29 MED ORDER — ONDANSETRON 4 MG PO TBDP: 4.0000 mg | ORAL_TABLET | Freq: Four times a day (QID) | ORAL | Status: DC | PRN

## 2020-12-29 MED ORDER — LOPERAMIDE HCL 2 MG PO CAPS: 2.0000 mg | ORAL_CAPSULE | ORAL | Status: DC | PRN

## 2020-12-29 MED ORDER — NAPROXEN 500 MG PO TABS
500.0000 mg | ORAL_TABLET | Freq: Two times a day (BID) | ORAL | Status: DC | PRN
  Administered 2020-12-31: 500 mg via ORAL
  Filled 2020-12-29: qty 1

## 2020-12-29 MED ORDER — METHOCARBAMOL 750 MG PO TABS
750.0000 mg | ORAL_TABLET | Freq: Three times a day (TID) | ORAL | Status: DC | PRN
  Administered 2020-12-30 – 2020-12-31 (×4): 750 mg via ORAL
  Filled 2020-12-29 (×4): qty 1

## 2020-12-29 NOTE — ED Provider Notes (Signed)
Behavioral Health Progress Note  Date and Time: 12/29/2020 1:04 PM Name: Lamorris Knoblock MRN:  [id number]  Subjective:   35 year old male with a history of depression and substance use who presented to [facility] ED  on1 0/28 with suicidal ideations in the context of being kicked out of a sober living house due to substance use.  He reported using fentanyl prior to presentation.  UDS +opioids..  Patient was recommended to transfer to the [facility] for further treatment and was transferred the same day.  Upon admission to the [facility] he was started on previous medications of Zoloft  50 mg, gabapentin 100 mg TID and trazodone.  Patient chart reviewed and seen this morning-patient has been compliant with scheduled medications and has been appropriate with staff and peers in the unit.  On a note on interview this morning patient is found laying in bed appearing mildly uncomfortable related to opioid withdrawal symptoms.  Most recent COWS 6.  On interview patient is somewhat irritable although agreeable to interview.  Patient describes his mood as "I feel bad but I am all right".  Patient indicates that he is going through opioid withdrawal and states that "I know I cannot get Subutex".  Discussed with patient that at this facility we are unable to prescribe Subutex; however, discussed that as needed medications for comfort are available to him for opioid withdrawal symptoms-informed patient that he will need to ask for medications.  Patient verbalized understanding.  Patient reports sleeping well overnight and describes appetite is variable.  Patient states that he had been in the sober living house for approximately 1.5 days before he relapsed on substances.  Patient states he states he used fentanyl yesterday prior to presentation.  Patient reports current opioid withdrawal symptoms of generalized body aches, rhinorrhea, watery eyes, chills.  He denies nausea, vomiting, diarrhea.  When attempting to obtain additional  psychiatric history patient states "this is making me sad" and expressed that he did not want to speak about this right now.  Patient states that he plans to go back to the sober living facility on Monday since that is when he is able to be accepted back.  Patient denies SI/HI/AVH.  Diagnosis:  Final diagnoses:  Substance induced mood disorder (HCC)  Opioid use disorder, severe, in early remission (HCC)    Total Time spent with patient: 15 minutes  Past Psychiatric History: MDD, substance use  Past Medical History: No past medical history on file. No past surgical history on file. Family History: No family history on file. Family Psychiatric  History: unknown Social History:  Social History   Substance and Sexual Activity  Alcohol Use Not on file     Social History   Substance and Sexual Activity  Drug Use Not on file    Social History   Socioeconomic History   Marital status: Single    Spouse name: Not on file   Number of children: Not on file   Years of education: Not on file   Highest education level: Not on file  Occupational History   Not on file  Tobacco Use   Smoking status: Some Days    Packs/day: 0.50    Years: 10.00    Pack years: 5.00    Types: Cigarettes   Smokeless tobacco: Never  Substance and Sexual Activity   Alcohol use: Not on file   Drug use: Not on file   Sexual activity: Not on file  Other Topics Concern   Not on file  Social History Narrative   Not on file   Social Determinants of Health   Financial Resource Strain: Not on file  Food Insecurity: Not on file  Transportation Needs: Not on file  Physical Activity: Not on file  Stress: Not on file  Social Connections: Not on file   SDOH:  SDOH Screenings   Alcohol Screen: Not on file  Depression (PHQ2-9): Low Risk    PHQ-2 Score: 0  Financial Resource Strain: Not on file  Food Insecurity: Not on file  Housing: Not on file  Physical Activity: Not on file  Social Connections: Not  on file  Stress: Not on file  Tobacco Use: High Risk   Smoking Tobacco Use: Some Days   Smokeless Tobacco Use: Never   Passive Exposure: Not on file  Transportation Needs: Not on file   Additional Social History:                         Sleep: Poor  Appetite:   "up and down"  Current Medications:  Current Facility-Administered Medications  Medication Dose Route Frequency Provider Last Rate Last Admin   acetaminophen (TYLENOL) tablet 650 mg  650 mg Oral Q6H PRN Lenard Lance, FNP       alum & mag hydroxide-simeth (MAALOX/MYLANTA) 200-200-20 MG/5ML suspension 30 mL  30 mL Oral Q4H PRN Lenard Lance, FNP       dicyclomine (BENTYL) tablet 20 mg  20 mg Oral Q6H PRN Estella Husk, MD       gabapentin (NEURONTIN) capsule 100 mg  100 mg Oral TID Lenard Lance, FNP   100 mg at 12/29/20 3244   hydrOXYzine (ATARAX/VISTARIL) tablet 25 mg  25 mg Oral TID PRN Lenard Lance, FNP       loperamide (IMODIUM) capsule 2-4 mg  2-4 mg Oral PRN Estella Husk, MD       magnesium hydroxide (MILK OF MAGNESIA) suspension 30 mL  30 mL Oral Daily PRN Lenard Lance, FNP       methocarbamol (ROBAXIN) tablet 750 mg  750 mg Oral Q8H PRN Estella Husk, MD       naproxen (NAPROSYN) tablet 500 mg  500 mg Oral BID PRN Estella Husk, MD       ondansetron (ZOFRAN-ODT) disintegrating tablet 4 mg  4 mg Oral Q6H PRN Estella Husk, MD       sertraline (ZOLOFT) tablet 50 mg  50 mg Oral Daily Lenard Lance, FNP   50 mg at 12/29/20 0917   traZODone (DESYREL) tablet 50 mg  50 mg Oral QHS PRN Lenard Lance, FNP   50 mg at 12/28/20 2138   Current Outpatient Medications  Medication Sig Dispense Refill   gabapentin (NEURONTIN) 100 MG capsule Take 1 capsule (100 mg total) by mouth 3 (three) times daily. 90 capsule 0   guanFACINE (INTUNIV) 1 MG TB24 ER tablet Take 1 tablet (1 mg total) by mouth in the morning. (Patient not taking: Reported on 12/28/2020) 30 tablet 1   hydrOXYzine  (ATARAX/VISTARIL) 25 MG tablet Take 1 tablet (25 mg total) by mouth 3 (three) times daily as needed for anxiety. 90 tablet 0   sertraline (ZOLOFT) 100 MG tablet Take 0.5 tablets (50 mg total) by mouth daily. (Patient taking differently: Take 100 mg by mouth daily.) 30 tablet 0    Labs  Lab Results:  Admission on 12/28/2020, Discharged on 12/28/2020  Component Date Value Ref Range  Status   WBC 12/28/2020 18.6 (A)  4.0 - 10.5 K/uL Final   RBC 12/28/2020 5.18  4.22 - 5.81 MIL/uL Final   Hemoglobin 12/28/2020 14.8  13.0 - 17.0 g/dL Final   HCT 50/35/4656 43.5  39.0 - 52.0 % Final   MCV 12/28/2020 84.0  80.0 - 100.0 fL Final   MCH 12/28/2020 28.6  26.0 - 34.0 pg Final   MCHC 12/28/2020 34.0  30.0 - 36.0 g/dL Final   RDW 81/27/5170 12.0  11.5 - 15.5 % Final   Platelets 12/28/2020 264  150 - 400 K/uL Final   nRBC 12/28/2020 0.0  0.0 - 0.2 % Final   Neutrophils Relative % 12/28/2020 82  % Final   Neutro Abs 12/28/2020 15.3 (A)  1.7 - 7.7 K/uL Final   Lymphocytes Relative 12/28/2020 11  % Final   Lymphs Abs 12/28/2020 2.1  0.7 - 4.0 K/uL Final   Monocytes Relative 12/28/2020 6  % Final   Monocytes Absolute 12/28/2020 1.1 (A)  0.1 - 1.0 K/uL Final   Eosinophils Relative 12/28/2020 0  % Final   Eosinophils Absolute 12/28/2020 0.0  0.0 - 0.5 K/uL Final   Basophils Relative 12/28/2020 0  % Final   Basophils Absolute 12/28/2020 0.0  0.0 - 0.1 K/uL Final   Immature Granulocytes 12/28/2020 1  % Final   Abs Immature Granulocytes 12/28/2020 0.09 (A)  0.00 - 0.07 K/uL Final   Performed at [facility], [address] [place], [place] [address]   Sodium 12/28/2020 136  135 - 145 mmol/L Final   Potassium 12/28/2020 4.0  3.5 - 5.1 mmol/L Final   Chloride 12/28/2020 99  98 - 111 mmol/L Final   CO2 12/28/2020 29  22 - 32 mmol/L Final   Glucose, Bld 12/28/2020 129 (A)  70 - 99 mg/dL Final   Glucose reference range applies only to samples taken after fasting for at least 8 hours.   BUN  12/28/2020 21 (A)  6 - 20 mg/dL Final   Creatinine, Ser 12/28/2020 0.85  0.61 - 1.24 mg/dL Final   Calcium 44/96/7591 9.5  8.9 - 10.3 mg/dL Final   Total Protein 63/84/6659 8.2 (A)  6.5 - 8.1 g/dL Final   Albumin 93/57/0177 4.8  3.5 - 5.0 g/dL Final   AST 93/90/3009 35  15 - 41 U/L Final   ALT 12/28/2020 73 (A)  0 - 44 U/L Final   Alkaline Phosphatase 12/28/2020 48  38 - 126 U/L Final   Total Bilirubin 12/28/2020 0.9  0.3 - 1.2 mg/dL Final   GFR, Estimated 12/28/2020 >60  >60 mL/min Final   Comment: (NOTE) Calculated using the CKD-EPI Creatinine Equation (2021)    Anion gap 12/28/2020 8  5 - 15 Final   Performed at [facility], [address], [place], [place] [address]   Salicylate Lvl 12/28/2020 <7.0 (A)  7.0 - 30.0 mg/dL Final   Performed at [facility], [address] [place], [place] [address]   Acetaminophen (Tylenol), Serum 12/28/2020 <10 (A)  10 - 30 ug/mL Final   Comment: (NOTE) Therapeutic concentrations vary significantly. A range of 10-30 ug/mL  may be an effective concentration for many patients. However, some  are best treated at concentrations outside of this range. Acetaminophen concentrations >150 ug/mL at 4 hours after ingestion  and >50 ug/mL at 12 hours after ingestion are often associated with  toxic reactions.  Performed at [facility], [address] [place],  [place] [address]    Color, Urine 12/28/2020 YELLOW  YELLOW Final   APPearance 12/28/2020 HAZY (A)  CLEAR Final   Specific Gravity, Urine 12/28/2020 1.026  1.005 - 1.030 Final   pH 12/28/2020 6.0  5.0 - 8.0 Final   Glucose, UA 12/28/2020 NEGATIVE  NEGATIVE mg/dL Final   Hgb urine dipstick 12/28/2020 NEGATIVE  NEGATIVE Final   Bilirubin Urine 12/28/2020 NEGATIVE  NEGATIVE Final   Ketones, ur 12/28/2020 5 (A)  NEGATIVE mg/dL Final   Protein, ur 60/45/4098 NEGATIVE  NEGATIVE mg/dL Final   Nitrite 11/91/4782 NEGATIVE  NEGATIVE Final    Leukocytes,Ua 12/28/2020 TRACE (A)  NEGATIVE Final   RBC / HPF 12/28/2020 0-5  0 - 5 RBC/hpf Final   WBC, UA 12/28/2020 21-50  0 - 5 WBC/hpf Final   Bacteria, UA 12/28/2020 RARE (A)  NONE SEEN Final   Squamous Epithelial / LPF 12/28/2020 0-5  0 - 5 Final   Mucus 12/28/2020 PRESENT   Final   Performed at [facility], [address], [place], [place] [address]   Opiates 12/28/2020 POSITIVE (A)  NONE DETECTED Final   Cocaine 12/28/2020 NONE DETECTED  NONE DETECTED Final   Benzodiazepines 12/28/2020 POSITIVE (A)  NONE DETECTED Final   Amphetamines 12/28/2020 NONE DETECTED  NONE DETECTED Final   Tetrahydrocannabinol 12/28/2020 NONE DETECTED  NONE DETECTED Final   Barbiturates 12/28/2020 NONE DETECTED  NONE DETECTED Final   Comment: (NOTE) DRUG SCREEN FOR MEDICAL PURPOSES ONLY.  IF CONFIRMATION IS NEEDED FOR ANY PURPOSE, NOTIFY LAB WITHIN 5 DAYS.  LOWEST DETECTABLE LIMITS FOR URINE DRUG SCREEN Drug Class                     Cutoff (ng/mL) Amphetamine and metabolites    1000 Barbiturate and metabolites    200 Benzodiazepine                 200 Tricyclics and metabolites     300 Opiates and metabolites        300 Cocaine and metabolites        300 THC                            50 Performed at [facility], [address], [place], [place] [address]    SARS Coronavirus 2 by RT PCR 12/28/2020 NEGATIVE  NEGATIVE Final   Comment: (NOTE) SARS-CoV-2 target nucleic acids are NOT DETECTED.  The SARS-CoV-2 RNA is generally detectable in upper respiratory specimens during the acute phase of infection. The lowest concentration of SARS-CoV-2 viral copies this assay can detect is 138 copies/mL. A negative result does not preclude SARS-Cov-2 infection and should not be used as the sole basis for treatment or other patient management decisions. A negative result may occur with  improper specimen collection/handling, submission of specimen other than  nasopharyngeal swab, presence of viral mutation(s) within the areas targeted by this assay, and inadequate number of viral copies(<138 copies/mL). A negative result must be combined with clinical observations, patient history, and epidemiological information. The expected result is Negative.  Fact Sheet for Patients:  BloggerCourse.com  Fact Sheet for Healthcare Providers:  SeriousBroker.it  This test is no                          t yet approved or cleared by the [place] FDA and  has been authorized for detection and/or  diagnosis of SARS-CoV-2 by FDA under an Emergency Use Authorization (EUA). This EUA will remain  in effect (meaning this test can be used) for the duration of the COVID-19 declaration under Section 564(b)(1) of the Act, 21 U.S.C.section 360bbb-3(b)(1), unless the authorization is terminated  or revoked sooner.       Influenza A by PCR 12/28/2020 NEGATIVE  NEGATIVE Final   Influenza B by PCR 12/28/2020 NEGATIVE  NEGATIVE Final   Comment: (NOTE) The Xpert Xpress SARS-CoV-2/FLU/RSV plus assay is intended as an aid in the diagnosis of influenza from Nasopharyngeal swab specimens and should not be used as a sole basis for treatment. Nasal washings and aspirates are unacceptable for Xpert Xpress SARS-CoV-2/FLU/RSV testing.  Fact Sheet for Patients: BloggerCourse.com  Fact Sheet for Healthcare Providers: SeriousBroker.it  This test is not yet approved or cleared by the [place] FDA and has been authorized for detection and/or diagnosis of SARS-CoV-2 by FDA under an Emergency Use Authorization (EUA). This EUA will remain in effect (meaning this test can be used) for the duration of the COVID-19 declaration under Section 564(b)(1) of the Act, 21 U.S.C. section 360bbb-3(b)(1), unless the authorization is terminated or revoked.  Performed at [facility], [address], [place], [place] [address]   Admission on 10/22/2020, Discharged on 10/23/2020  Component Date Value Ref Range Status   Hgb A1c MFr Bld 10/22/2020 5.4  4.8 - 5.6 % Final   Comment: (NOTE) Pre diabetes:          5.7%-6.4%  Diabetes:              >6.4%  Glycemic control for   <7.0% adults with diabetes    Mean Plasma Glucose 10/22/2020 108.28  mg/dL Final   Performed at [facility], [address], [place], [place] [address]   Cholesterol 10/22/2020 180  0 - 200 mg/dL Final   Triglycerides 09/81/1914 57  <150 mg/dL Final   HDL 78/29/5621 48  >40 mg/dL Final   Total CHOL/HDL Ratio 10/22/2020 3.8  RATIO Final   VLDL 10/22/2020 11  0 - 40 mg/dL Final   LDL Cholesterol 10/22/2020 121 (A)  0 - 99 mg/dL Final   Comment:        Total Cholesterol/HDL:CHD Risk Coronary Heart Disease Risk Table                     Men   Women  1/2 Average Risk   3.4   3.3  Average Risk       5.0   4.4  2 X Average Risk   9.6   7.1  3 X Average Risk  23.4   11.0        Use the calculated Patient Ratio above and the CHD Risk Table to determine the patient's CHD Risk.        ATP III CLASSIFICATION (LDL):  <100     mg/dL   Optimal  308-657  mg/dL   Near or Above                    Optimal  130-159  mg/dL   Borderline  846-962  mg/dL   High  >952     mg/dL   Very High Performed at [facility], [address] [place], [place] [address]    TSH 10/22/2020 0.610  0.350 - 4.500 uIU/mL Final   Comment: Performed by a 3rd Generation assay with a functional sensitivity  of <=0.01 uIU/mL. Performed at [facility], [address] [place], [place] [address]   Admission on 10/22/2020, Discharged on 10/22/2020  Component Date Value Ref Range Status   Sodium 10/22/2020 136  135 - 145 mmol/L Final   Potassium 10/22/2020 3.9  3.5 - 5.1 mmol/L Final   Chloride 10/22/2020 103  98 - 111 mmol/L Final   CO2 10/22/2020 25  22 - 32 mmol/L Final    Glucose, Bld 10/22/2020 109 (A)  70 - 99 mg/dL Final   Glucose reference range applies only to samples taken after fasting for at least 8 hours.   BUN 10/22/2020 17  6 - 20 mg/dL Final   Creatinine, Ser 10/22/2020 0.83  0.61 - 1.24 mg/dL Final   Calcium 74/25/9563 9.1  8.9 - 10.3 mg/dL Final   Total Protein 87/56/4332 7.6  6.5 - 8.1 g/dL Final   Albumin 95/18/8416 4.6  3.5 - 5.0 g/dL Final   AST 60/63/0160 25  15 - 41 U/L Final   ALT 10/22/2020 34  0 - 44 U/L Final   Alkaline Phosphatase 10/22/2020 43  38 - 126 U/L Final   Total Bilirubin 10/22/2020 1.1  0.3 - 1.2 mg/dL Final   GFR, Estimated 10/22/2020 >60  >60 mL/min Final   Comment: (NOTE) Calculated using the CKD-EPI Creatinine Equation (2021)    Anion gap 10/22/2020 8  5 - 15 Final   Performed at [facility], [address] [place], [place] [address]   Acetaminophen (Tylenol), Serum 10/22/2020 <10 (A)  10 - 30 ug/mL Final   Comment: (NOTE) Therapeutic concentrations vary significantly. A range of 10-30 ug/mL  may be an effective concentration for many patients. However, some  are best treated at concentrations outside of this range. Acetaminophen concentrations >150 ug/mL at 4 hours after ingestion  and >50 ug/mL at 12 hours after ingestion are often associated with  toxic reactions.  Performed at [facility], [address] [place], [place] [address]    Alcohol, Ethyl (B) 10/22/2020 <10  <10 mg/dL Final   Comment: (NOTE) Lowest detectable limit for serum alcohol is 10 mg/dL.  For medical purposes only. Performed at [facility], [address], [place], [place] [address]    Salicylate Lvl 10/22/2020 <7.0 (A)  7.0 - 30.0 mg/dL Final   Performed at [facility], [address], [place], [place] [address]   WBC 10/22/2020 18.2 (A)  4.0 - 10.5 K/uL Final   RBC 10/22/2020 4.52  4.22 - 5.81 MIL/uL Final   Hemoglobin 10/22/2020 13.2  13.0 - 17.0  g/dL Final   HCT 23/76/2831 38.5 (A)  39.0 - 52.0 % Final   MCV 10/22/2020 85.2  80.0 - 100.0 fL Final   MCH 10/22/2020 29.2  26.0 - 34.0 pg Final   MCHC 10/22/2020 34.3  30.0 - 36.0 g/dL Final   RDW 51/76/1607 12.9  11.5 - 15.5 % Final   Platelets 10/22/2020 226  150 - 400 K/uL Final   nRBC 10/22/2020 0.0  0.0 - 0.2 % Final   Neutrophils Relative % 10/22/2020 84  % Final   Neutro Abs 10/22/2020 15.3 (A)  1.7 - 7.7 K/uL Final   Lymphocytes Relative 10/22/2020 9  % Final   Lymphs Abs 10/22/2020 1.6  0.7 - 4.0 K/uL Final   Monocytes Relative 10/22/2020 7  % Final   Monocytes Absolute 10/22/2020 1.2 (A)  0.1 - 1.0 K/uL Final   Eosinophils Relative 10/22/2020  0  % Final   Eosinophils Absolute 10/22/2020 0.0  0.0 - 0.5 K/uL Final   Basophils Relative 10/22/2020 0  % Final   Basophils Absolute 10/22/2020 0.0  0.0 - 0.1 K/uL Final   Immature Granulocytes 10/22/2020 0  % Final   Abs Immature Granulocytes 10/22/2020 0.07  0.00 - 0.07 K/uL Final   Performed at [facility], [address], [place], [place] [address]   Opiates 10/22/2020 NONE DETECTED  NONE DETECTED Final   Cocaine 10/22/2020 NONE DETECTED  NONE DETECTED Final   Benzodiazepines 10/22/2020 NONE DETECTED  NONE DETECTED Final   Amphetamines 10/22/2020 NONE DETECTED  NONE DETECTED Final   Tetrahydrocannabinol 10/22/2020 NONE DETECTED  NONE DETECTED Final   Barbiturates 10/22/2020 NONE DETECTED  NONE DETECTED Final   Comment: (NOTE) DRUG SCREEN FOR MEDICAL PURPOSES ONLY.  IF CONFIRMATION IS NEEDED FOR ANY PURPOSE, NOTIFY LAB WITHIN 5 DAYS.  LOWEST DETECTABLE LIMITS FOR URINE DRUG SCREEN Drug Class                     Cutoff (ng/mL) Amphetamine and metabolites    1000 Barbiturate and metabolites    200 Benzodiazepine                 200 Tricyclics and metabolites     300 Opiates and metabolites        300 Cocaine and metabolites        300 THC                            50 Performed at [facility], [address] [place], [place] [address]    SARS Coronavirus 2 by RT PCR 10/22/2020 NEGATIVE  NEGATIVE Final   Comment: (NOTE) SARS-CoV-2 target nucleic acids are NOT DETECTED.  The SARS-CoV-2 RNA is generally detectable in upper respiratory specimens during the acute phase of infection. The lowest concentration of SARS-CoV-2 viral copies this assay can detect is 138 copies/mL. A negative result does not preclude SARS-Cov-2 infection and should not be used as the sole basis for treatment or other patient management decisions. A negative result may occur with  improper specimen collection/handling, submission of specimen other than nasopharyngeal swab, presence of viral mutation(s) within the areas targeted by this assay, and inadequate number of viral copies(<138 copies/mL). A negative result must be combined with clinical observations, patient history, and epidemiological information. The expected result is Negative.  Fact Sheet for Patients:  BloggerCourse.com  Fact Sheet for Healthcare Providers:  SeriousBroker.it  This test is no                          t yet approved or cleared by the [place] FDA and  has been authorized for detection and/or diagnosis of SARS-CoV-2 by FDA under an Emergency Use Authorization (EUA). This EUA will remain  in effect (meaning this test can be used) for the duration of the COVID-19 declaration under Section 564(b)(1) of the Act, 21 U.S.C.section 360bbb-3(b)(1), unless the authorization is terminated  or revoked sooner.       Influenza A by PCR 10/22/2020 NEGATIVE  NEGATIVE Final   Influenza B by PCR 10/22/2020 NEGATIVE  NEGATIVE Final   Comment: (NOTE) The Xpert Xpress SARS-CoV-2/FLU/RSV plus assay is intended as an aid in the diagnosis of influenza from Nasopharyngeal swab specimens and should not be used as a sole basis for  treatment. Nasal washings  and aspirates are unacceptable for Xpert Xpress SARS-CoV-2/FLU/RSV testing.  Fact Sheet for Patients: BloggerCourse.com  Fact Sheet for Healthcare Providers: SeriousBroker.it  This test is not yet approved or cleared by the [place] FDA and has been authorized for detection and/or diagnosis of SARS-CoV-2 by FDA under an Emergency Use Authorization (EUA). This EUA will remain in effect (meaning this test can be used) for the duration of the COVID-19 declaration under Section 564(b)(1) of the Act, 21 U.S.C. section 360bbb-3(b)(1), unless the authorization is terminated or revoked.  Performed at [facility], [address], [place], [place] [address]     Blood Alcohol level:  Lab Results  Component Value Date   ETH <10 10/22/2020    Metabolic Disorder Labs: Lab Results  Component Value Date   HGBA1C 5.4 10/22/2020   MPG 108.28 10/22/2020   No results found for: PROLACTIN Lab Results  Component Value Date   CHOL 180 10/22/2020   TRIG 57 10/22/2020   HDL 48 10/22/2020   CHOLHDL 3.8 10/22/2020   VLDL 11 10/22/2020   LDLCALC 121 (H) 10/22/2020    Therapeutic Lab Levels: No results found for: LITHIUM No results found for: VALPROATE No components found for:  CBMZ  Physical Findings   GAD-7    Flowsheet Row Office Visit from 08/29/2020 in [facility]  Total GAD-7 Score 2      PHQ2-9    Flowsheet Row Office Visit from 08/29/2020 in [place]  PHQ-2 Total Score 0      Flowsheet Row ED from 12/28/2020 in [facility] Most recent reading at 12/28/2020  6:08 PM ED from 12/28/2020 in [facility] Most recent reading at 12/28/2020  2:11 AM ED from 10/22/2020 in [facility] Most recent reading at 10/22/2020  8:50 AM  C-SSRS RISK CATEGORY High Risk  High Risk Low Risk        Musculoskeletal  Strength & Muscle Tone:  did not assess, patient laying in bed  Gait & Station:  did not assess, patient laying in bed Patient leans:  did not assess, patient laying in bed  Psychiatric Specialty Exam  Presentation  General Appearance: Appropriate for Environment; Casual  Eye Contact:Good  Speech:Clear and Coherent; Normal Rate  Speech Volume:Normal  Handedness:Right   Mood and Affect  Mood:Euthymic  Affect:Appropriate; Congruent   Thought Process  Thought Processes:Coherent; Goal Directed  Descriptions of Associations:Intact  Orientation:Full (Time, Place and Person)  Thought Content:WDL; Logical  Diagnosis of Schizophrenia or Schizoaffective disorder in past: No    Hallucinations:Hallucinations: None  Ideas of Reference:None  Suicidal Thoughts:Suicidal Thoughts: No  Homicidal Thoughts:Homicidal Thoughts: No   Sensorium  Memory:Immediate Good; Recent Good; Remote Good  Judgment:Fair  Insight:Fair   Executive Functions  Concentration:Fair  Attention Span:Fair  Recall:Fair  Fund of Knowledge:Fair  Language:Fair   Psychomotor Activity  Psychomotor Activity:Psychomotor Activity: Normal   Assets  Assets:Communication Skills; Desire for Improvement; Financial Resources/Insurance; Physical Health; Resilience   Sleep  Sleep:Sleep: Fair   Nutritional Assessment (For [facility] and [facility] admissions only) Has the patient had a weight loss or gain of 10 pounds or more in the last 3 months?: No Has the patient had a decrease in food intake/or appetite?: No Does the patient have dental problems?: No Does the patient have eating habits or behaviors that may be indicators of an eating disorder including binging or inducing vomiting?: No Has the  patient recently lost weight without trying?: 0 Has the patient been eating poorly because of a decreased appetite?: 0 Malnutrition Screening Tool Score:  0    Physical Exam  Physical Exam Constitutional:      Appearance: Normal appearance. He is normal weight.  HENT:     Head: Normocephalic and atraumatic.  Eyes:     Extraocular Movements: Extraocular movements intact.  Cardiovascular:     Rate and Rhythm: Normal rate and regular rhythm.     Heart sounds: Normal heart sounds.  Pulmonary:     Effort: Pulmonary effort is normal.     Breath sounds: Normal breath sounds.  Neurological:     General: No focal deficit present.     Mental Status: He is alert and oriented to person, place, and time.  Psychiatric:        Attention and Perception: Attention and perception normal.        Speech: Speech normal.        Behavior: Behavior normal. Behavior is cooperative.        Thought Content: Thought content normal.   Review of Systems  Constitutional:  Positive for chills and malaise/fatigue. Negative for fever.  HENT:  Negative for hearing loss.        +watery eyes +runny nose  Eyes:  Negative for discharge and redness.  Respiratory:  Negative for cough.   Cardiovascular:  Negative for chest pain.  Gastrointestinal:  Negative for abdominal pain, diarrhea, nausea and vomiting.  Musculoskeletal:  Negative for myalgias.  Neurological:  Negative for headaches.  Psychiatric/Behavioral:  Positive for depression and substance abuse. Negative for suicidal ideas.   Blood pressure 108/82, pulse 65, temperature 97.8 F (36.6 C), temperature source Tympanic, resp. rate 16, SpO2 99 %. There is no height or weight on file to calculate BMI.  Treatment Plan Summary: 35 year old male with a history of depression and substance use who presented to [facility] ED  on10/28 with suicidal ideations in the context of being kicked out of a sober living house due to substance use.  He reported using fentanyl prior to presentation.  UDS +opioids..  Patient was recommended to transfer to the [facility] for further treatment.  Upon admission to the [facility] he was started on  previous medications of Zoloft  50 mg, gabapentin 100 mg TID and trazodone.  Today patient denies SI/HI/AVH and reports opioid withdrawal sx of body aches, chills, rhinorrhea, water eyes. PRNs for opioid withdrawal made available to assist with comfort while detoxing. Patient remains appropriate for treatment at the [facility] for continued opioid detox.  Depression Substance induced mood diosrder anxiety -continue zoloft 50 mg -continue gabapentin 100 mg TID  Substance induced mood disorder Opioid abuse Opiate Withdrawal Protocol: -Clonidine 0.1mg  PO q4hr PRN for withdrawal associated HTN -Bentyl 10mg  PO q6hr PRN for abdominal muscle cramps -Loperamide 2mg  PO q6hr PRN for diarrhea -Robaxin 750mg  PO q8hr PRN for muscle spasm -Zofran 4mg  SL q8hr PRN for nausea -Vistaril 25mg  PO q8hr PRN for anxiety  Dispo: SW assisting. Likely discharge back to sober living facility on Monday    , MD 12/29/2020 1:04 PM

## 2020-12-29 NOTE — ED Notes (Signed)
Pt asleep in bed. Respirations even and unlabored. Will continue to monitor for safety. ?

## 2020-12-29 NOTE — Progress Notes (Signed)
Pt is resting quietly. No signs of acute distress noted. Monitoring for pt's safety. 

## 2020-12-29 NOTE — ED Notes (Signed)
Pt eating breakfast 

## 2020-12-29 NOTE — Progress Notes (Addendum)
Pt was observed pacing on the unit. Pt complained general body ache. Pt declined PRN Tylenol when offered. Pt inform writer that he is withdrawing from opiates and ask if he can be prescribe Subutex. Pt was informed that this facility does not prescribe Subutex. Pt was informed that MD will be notified. Pt told writer not to notify MD because he is not going to be prescribed Subutex. Pt stated " I will just deal with it."  Pt's COWS was a 6. No acute distress noted. Staff will monitor for pt's safety.

## 2020-12-29 NOTE — Progress Notes (Signed)
Pt is awake, alert and oriented. Pt did not voice any complaints of pain or discomfort. Administered scheduled meds with no incident. Pt denies pain and current SI/HI/AVH. Staff will monitor for pt's safety.

## 2020-12-29 NOTE — ED Notes (Signed)
Pt was given psycho-education materials. Pt verbalized understanding of material and identified how the material would be helpful to his overall wellness.

## 2020-12-29 NOTE — ED Notes (Signed)
Pt eating lunch

## 2020-12-29 NOTE — ED Notes (Signed)
Pt resting in bed. A&O x4, calm and cooperative. Pt denies current SI/HI/AVH. Pt denies any current needs. No signs of acute distress noted. Will continue to monitor for safety.  

## 2020-12-29 NOTE — Clinical Social Work Psych Note (Addendum)
CSW Initial Note  CSW  meet with patient for introduction and to begin discussions regarding potential discharge planning.   Zachary Freeman shared that he came to [facility] for detox services only. Patient is a resident at [facility], however must complete 5  days of detox in order to return. As of now, the patient is not interested in any residential treatment referrals.   CSW charted follow up resources in patient's AVS for outpatient psychiatric services such as medication management and outpatient therapy, as well as potential outpatient substance abuse therapy services.   CSW also placed additional resources for housing and other treatment options in the patient's physical chart on the [facility] unit.   CSW will continue to follow.    Baldo Daub, MSW, LCSW Clinical Child psychotherapist (Facility Based Crisis) [facility]

## 2020-12-29 NOTE — ED Notes (Signed)
PT sleeping, no distress noted, will continue to monitor patient for safety

## 2020-12-30 DIAGNOSIS — F329 Major depressive disorder, single episode, unspecified: Secondary | ICD-10-CM | POA: Diagnosis not present

## 2020-12-30 DIAGNOSIS — Z9151 Personal history of suicidal behavior: Secondary | ICD-10-CM | POA: Diagnosis not present

## 2020-12-30 DIAGNOSIS — F1994 Other psychoactive substance use, unspecified with psychoactive substance-induced mood disorder: Secondary | ICD-10-CM | POA: Diagnosis not present

## 2020-12-30 DIAGNOSIS — F1121 Opioid dependence, in remission: Secondary | ICD-10-CM | POA: Diagnosis not present

## 2020-12-30 NOTE — Progress Notes (Signed)
Pt compliant with medications as ordered at bedtime.  Medication education provided with patient verbalizing understanding of same.  He had no concerns, no questions.

## 2020-12-30 NOTE — ED Notes (Signed)
Pt sleeping in no acute distress. RR even and unlabored. Safety maintained. 

## 2020-12-30 NOTE — Progress Notes (Signed)
Pt in bed with even respirations, sleeping.

## 2020-12-30 NOTE — ED Notes (Signed)
Pt received am medication without difficulty. Requested Robaxin for "muscle spasms". Pt received without difficulty. Pt states, "I didn't sleep good last night because of my RLS and I'm going thru withdrawals". Pt reports cold chills. Denies SI/HI/AVH currently. VSS. Informed pt to notify staff with any needs or concerns. Verbalized understanding. Will monitor for safety.

## 2020-12-30 NOTE — Progress Notes (Signed)
Pt is asleep. Respirations are even and unlabored. No distress noted. Monitoring for pt's safety. °

## 2020-12-30 NOTE — ED Notes (Signed)
Pt sleeping in the bed. Respirations are even and unlabored. No acute distress noted. Will continue to monitor for safety.

## 2020-12-30 NOTE — ED Notes (Signed)
Per report, patient has scheduled a phone appointment at 1200 tomorrow and again at 8pm for two different [place] housing options.  He would like to secure a place to go to as soon as possible and would like to be able to discharge tomorrow 12/31/20

## 2020-12-30 NOTE — ED Provider Notes (Signed)
Behavioral Health Progress Note  Date and Time: 12/30/2020 10:03 PM Name: Zachary Freeman MRN:  [id number]  Subjective:    Zachary Freeman is a 35 year old male with a past psychiatric history significant for depression, substance-induced mood disorder and opioid use disorder who was admitted to [facility] due substance induced mood disorder.  Patient was seen and reevaluated face-to-face by this provider.  Prior to patient's admission to the Facility Based [facility], patient presented to [facility], ED due to suicidal ideations in the context of being kicked out of his previous sober living house.  Patient was expelled from his previous sober living house due to reported use of fentanyl.  During evaluation, patient had a urine drug screen significant for the presence of opioids.  Patient was eventually transferred to the [facility] and started on the following medications: Zoloft 50 mg daily, gabapentin 100 mg 3 times daily, and trazodone.  During the evaluation, patient states, "I'm depressed as shit."  Patient states that he has also been so anxious that he has been picking his nails to the nub.  Patient rates his anxiety an 8 out of 10.  Patient's current stressor includes finding another sober living house to limit.  Patient states that he may have some potential leads for resources tied to [facility] in [place] and or [employer].  Patient is alert and oriented x4.  He is visibly irritable but remains cooperative during the evaluation.  When asked about mood patient stated, "I don't know what I am.  I'm sad as hell and feel depressed.  Patient denies suicidal or homicidal ideations.  He further denies auditory or visual hallucinations but does not appear to be responding to internal/external stimuli.  Patient endorses poor sleep stating that he tosses and turns throughout the night.  Patient endorses receiving roughly 2 hours of  sleep last night.  Patient endorses decreased appetite.  Patient denies tobacco use and alcohol consumption.  Patient endorses illicit drug use in the form of fentanyl and benzodiazepines.  Patient endorses the following withdrawal symptoms: chills, cold sweats, body aches, and nausea.  Patient denies diarrhea and fever.  Diagnosis:  Final diagnoses:  Substance induced mood disorder (HCC)  Opioid use disorder, severe, in early remission (HCC)    Total Time spent with patient: 15 minutes  Past Psychiatric History:  Major depressive disorder Substance induced mood disorder Opioid use disorder  Past Medical History: No past medical history on file. No past surgical history on file. Family History: No family history on file. Family Psychiatric  History: Unknown Social History:  Social History   Substance and Sexual Activity  Alcohol Use Not on file     Social History   Substance and Sexual Activity  Drug Use Not on file    Social History   Socioeconomic History   Marital status: Single    Spouse name: Not on file   Number of children: Not on file   Years of education: Not on file   Highest education level: Not on file  Occupational History   Not on file  Tobacco Use   Smoking status: Some Days    Packs/day: 0.50    Years: 10.00    Pack years: 5.00    Types: Cigarettes   Smokeless tobacco: Never  Substance and Sexual Activity   Alcohol use: Not on file   Drug use: Not on file   Sexual activity: Not on file  Other Topics Concern  Not on file  Social History Narrative   Not on file   Social Determinants of Health   Financial Resource Strain: Not on file  Food Insecurity: Not on file  Transportation Needs: Not on file  Physical Activity: Not on file  Stress: Not on file  Social Connections: Not on file   SDOH:  SDOH Screenings   Alcohol Screen: Not on file  Depression (PHQ2-9): Low Risk    PHQ-2 Score: 0  Financial Resource Strain: Not on file  Food  Insecurity: Not on file  Housing: Not on file  Physical Activity: Not on file  Social Connections: Not on file  Stress: Not on file  Tobacco Use: High Risk   Smoking Tobacco Use: Some Days   Smokeless Tobacco Use: Never   Passive Exposure: Not on file  Transportation Needs: Not on file   Additional Social History:                         Sleep: Poor  Appetite:  Poor  Current Medications:  Current Facility-Administered Medications  Medication Dose Route Frequency Provider Last Rate Last Admin   acetaminophen (TYLENOL) tablet 650 mg  650 mg Oral Q6H PRN Lenard Lance, FNP       alum & mag hydroxide-simeth (MAALOX/MYLANTA) 200-200-20 MG/5ML suspension 30 mL  30 mL Oral Q4H PRN Lenard Lance, FNP       dicyclomine (BENTYL) tablet 20 mg  20 mg Oral Q6H PRN Estella Husk, MD       gabapentin (NEURONTIN) capsule 100 mg  100 mg Oral TID Lenard Lance, FNP   100 mg at 12/30/20 2104   hydrOXYzine (ATARAX/VISTARIL) tablet 25 mg  25 mg Oral TID PRN Lenard Lance, FNP   25 mg at 12/30/20 2104   loperamide (IMODIUM) capsule 2-4 mg  2-4 mg Oral PRN Estella Husk, MD       magnesium hydroxide (MILK OF MAGNESIA) suspension 30 mL  30 mL Oral Daily PRN Lenard Lance, FNP       methocarbamol (ROBAXIN) tablet 750 mg  750 mg Oral Q8H PRN Estella Husk, MD   750 mg at 12/30/20 2104   naproxen (NAPROSYN) tablet 500 mg  500 mg Oral BID PRN Estella Husk, MD       ondansetron (ZOFRAN-ODT) disintegrating tablet 4 mg  4 mg Oral Q6H PRN Estella Husk, MD       sertraline (ZOLOFT) tablet 50 mg  50 mg Oral Daily Lenard Lance, FNP   50 mg at 12/30/20 0904   traZODone (DESYREL) tablet 50 mg  50 mg Oral QHS PRN Lenard Lance, FNP   50 mg at 12/30/20 2104   Current Outpatient Medications  Medication Sig Dispense Refill   gabapentin (NEURONTIN) 100 MG capsule Take 1 capsule (100 mg total) by mouth 3 (three) times daily. 90 capsule 0   guanFACINE (INTUNIV) 1 MG TB24 ER  tablet Take 1 tablet (1 mg total) by mouth in the morning. (Patient not taking: Reported on 12/28/2020) 30 tablet 1   hydrOXYzine (ATARAX/VISTARIL) 25 MG tablet Take 1 tablet (25 mg total) by mouth 3 (three) times daily as needed for anxiety. 90 tablet 0   sertraline (ZOLOFT) 100 MG tablet Take 0.5 tablets (50 mg total) by mouth daily. (Patient taking differently: Take 100 mg by mouth daily.) 30 tablet 0    Labs  Lab Results:  Admission on 12/28/2020, Discharged on  12/28/2020  Component Date Value Ref Range Status   WBC 12/28/2020 18.6 (A)  4.0 - 10.5 K/uL Final   RBC 12/28/2020 5.18  4.22 - 5.81 MIL/uL Final   Hemoglobin 12/28/2020 14.8  13.0 - 17.0 g/dL Final   HCT 09/81/1914 43.5  39.0 - 52.0 % Final   MCV 12/28/2020 84.0  80.0 - 100.0 fL Final   MCH 12/28/2020 28.6  26.0 - 34.0 pg Final   MCHC 12/28/2020 34.0  30.0 - 36.0 g/dL Final   RDW 78/29/5621 12.0  11.5 - 15.5 % Final   Platelets 12/28/2020 264  150 - 400 K/uL Final   nRBC 12/28/2020 0.0  0.0 - 0.2 % Final   Neutrophils Relative % 12/28/2020 82  % Final   Neutro Abs 12/28/2020 15.3 (A)  1.7 - 7.7 K/uL Final   Lymphocytes Relative 12/28/2020 11  % Final   Lymphs Abs 12/28/2020 2.1  0.7 - 4.0 K/uL Final   Monocytes Relative 12/28/2020 6  % Final   Monocytes Absolute 12/28/2020 1.1 (A)  0.1 - 1.0 K/uL Final   Eosinophils Relative 12/28/2020 0  % Final   Eosinophils Absolute 12/28/2020 0.0  0.0 - 0.5 K/uL Final   Basophils Relative 12/28/2020 0  % Final   Basophils Absolute 12/28/2020 0.0  0.0 - 0.1 K/uL Final   Immature Granulocytes 12/28/2020 1  % Final   Abs Immature Granulocytes 12/28/2020 0.09 (A)  0.00 - 0.07 K/uL Final   Performed at [facility], [address] [place], [place] [address]   Sodium 12/28/2020 136  135 - 145 mmol/L Final   Potassium 12/28/2020 4.0  3.5 - 5.1 mmol/L Final   Chloride 12/28/2020 99  98 - 111 mmol/L Final   CO2 12/28/2020 29  22 - 32 mmol/L Final   Glucose, Bld  12/28/2020 129 (A)  70 - 99 mg/dL Final   Glucose reference range applies only to samples taken after fasting for at least 8 hours.   BUN 12/28/2020 21 (A)  6 - 20 mg/dL Final   Creatinine, Ser 12/28/2020 0.85  0.61 - 1.24 mg/dL Final   Calcium 78/46/9629 9.5  8.9 - 10.3 mg/dL Final   Total Protein 52/84/1324 8.2 (A)  6.5 - 8.1 g/dL Final   Albumin 40/12/2723 4.8  3.5 - 5.0 g/dL Final   AST 36/64/4034 35  15 - 41 U/L Final   ALT 12/28/2020 73 (A)  0 - 44 U/L Final   Alkaline Phosphatase 12/28/2020 48  38 - 126 U/L Final   Total Bilirubin 12/28/2020 0.9  0.3 - 1.2 mg/dL Final   GFR, Estimated 12/28/2020 >60  >60 mL/min Final   Comment: (NOTE) Calculated using the CKD-EPI Creatinine Equation (2021)    Anion gap 12/28/2020 8  5 - 15 Final   Performed at [facility], [address] [place], [place] [address]   Salicylate Lvl 12/28/2020 <7.0 (A)  7.0 - 30.0 mg/dL Final   Performed at [facility], [address] [place], [place] [address]   Acetaminophen (Tylenol), Serum 12/28/2020 <10 (A)  10 - 30 ug/mL Final   Comment: (NOTE) Therapeutic concentrations vary significantly. A range of 10-30 ug/mL  may be an effective concentration for many patients. However, some  are best treated at concentrations outside of this range. Acetaminophen concentrations >150 ug/mL at 4 hours after ingestion  and >50 ug/mL at 12 hours after ingestion are often associated with  toxic reactions.  Performed at [facility], [address], [place], [place] [address]    Color, Urine 12/28/2020 YELLOW  YELLOW Final   APPearance 12/28/2020 HAZY (A)  CLEAR Final   Specific Gravity, Urine 12/28/2020 1.026  1.005 - 1.030 Final   pH 12/28/2020 6.0  5.0 - 8.0 Final   Glucose, UA 12/28/2020 NEGATIVE  NEGATIVE mg/dL Final   Hgb urine dipstick 12/28/2020 NEGATIVE  NEGATIVE Final   Bilirubin Urine 12/28/2020 NEGATIVE  NEGATIVE Final   Ketones, ur 12/28/2020 5  (A)  NEGATIVE mg/dL Final   Protein, ur 58/11/9831 NEGATIVE  NEGATIVE mg/dL Final   Nitrite 82/50/5397 NEGATIVE  NEGATIVE Final   Leukocytes,Ua 12/28/2020 TRACE (A)  NEGATIVE Final   RBC / HPF 12/28/2020 0-5  0 - 5 RBC/hpf Final   WBC, UA 12/28/2020 21-50  0 - 5 WBC/hpf Final   Bacteria, UA 12/28/2020 RARE (A)  NONE SEEN Final   Squamous Epithelial / LPF 12/28/2020 0-5  0 - 5 Final   Mucus 12/28/2020 PRESENT   Final   Performed at [facility], [address] [place], [place] [address]   Opiates 12/28/2020 POSITIVE (A)  NONE DETECTED Final   Cocaine 12/28/2020 NONE DETECTED  NONE DETECTED Final   Benzodiazepines 12/28/2020 POSITIVE (A)  NONE DETECTED Final   Amphetamines 12/28/2020 NONE DETECTED  NONE DETECTED Final   Tetrahydrocannabinol 12/28/2020 NONE DETECTED  NONE DETECTED Final   Barbiturates 12/28/2020 NONE DETECTED  NONE DETECTED Final   Comment: (NOTE) DRUG SCREEN FOR MEDICAL PURPOSES ONLY.  IF CONFIRMATION IS NEEDED FOR ANY PURPOSE, NOTIFY LAB WITHIN 5 DAYS.  LOWEST DETECTABLE LIMITS FOR URINE DRUG SCREEN Drug Class                     Cutoff (ng/mL) Amphetamine and metabolites    1000 Barbiturate and metabolites    200 Benzodiazepine                 200 Tricyclics and metabolites     300 Opiates and metabolites        300 Cocaine and metabolites        300 THC                            50 Performed at [facility], [address] [place], [place] [address]    SARS Coronavirus 2 by RT PCR 12/28/2020 NEGATIVE  NEGATIVE Final   Comment: (NOTE) SARS-CoV-2 target nucleic acids are NOT DETECTED.  The SARS-CoV-2 RNA is generally detectable in upper respiratory specimens during the acute phase of infection. The lowest concentration of SARS-CoV-2 viral copies this assay can detect is 138 copies/mL. A negative result does not preclude SARS-Cov-2 infection and should not be used as the sole basis for treatment or other patient  management decisions. A negative result may occur with  improper specimen collection/handling, submission of specimen other than nasopharyngeal swab, presence of viral mutation(s) within the areas targeted by this assay, and inadequate number of viral copies(<138 copies/mL). A negative result must be combined with clinical observations, patient history, and epidemiological information. The expected result is Negative.  Fact Sheet for Patients:  BloggerCourse.com  Fact Sheet for Healthcare Providers:  SeriousBroker.it  This test is no                          t yet approved or cleared by the [place] and  has been authorized for detection and/or diagnosis of SARS-CoV-2 by FDA under an Emergency Use Authorization (EUA). This EUA will remain  in effect (meaning this test can be used) for the duration of the COVID-19 declaration under Section 564(b)(1) of the Act, 21 U.S.C.section 360bbb-3(b)(1), unless the authorization is terminated  or revoked sooner.       Influenza A by PCR 12/28/2020 NEGATIVE  NEGATIVE Final   Influenza B by PCR 12/28/2020 NEGATIVE  NEGATIVE Final   Comment: (NOTE) The Xpert Xpress SARS-CoV-2/FLU/RSV plus assay is intended as an aid in the diagnosis of influenza from Nasopharyngeal swab specimens and should not be used as a sole basis for treatment. Nasal washings and aspirates are unacceptable for Xpert Xpress SARS-CoV-2/FLU/RSV testing.  Fact Sheet for Patients: BloggerCourse.com  Fact Sheet for Healthcare Providers: SeriousBroker.it  This test is not yet approved or cleared by the [place] FDA and has been authorized for detection and/or diagnosis of SARS-CoV-2 by FDA under an Emergency Use Authorization (EUA). This EUA will remain in effect (meaning this test can be used) for the duration of the COVID-19 declaration under Section 564(b)(1)  of the Act, 21 U.S.C. section 360bbb-3(b)(1), unless the authorization is terminated or revoked.  Performed at [facility], [address] [place], [place] [address]   Admission on 10/22/2020, Discharged on 10/23/2020  Component Date Value Ref Range Status   Hgb A1c MFr Bld 10/22/2020 5.4  4.8 - 5.6 % Final   Comment: (NOTE) Pre diabetes:          5.7%-6.4%  Diabetes:              >6.4%  Glycemic control for   <7.0% adults with diabetes    Mean Plasma Glucose 10/22/2020 108.28  mg/dL Final   Performed at [facility], [address], [place], [place] [address]   Cholesterol 10/22/2020 180  0 - 200 mg/dL Final   Triglycerides 73/53/2992 57  <150 mg/dL Final   HDL 42/68/3419 48  >40 mg/dL Final   Total CHOL/HDL Ratio 10/22/2020 3.8  RATIO Final   VLDL 10/22/2020 11  0 - 40 mg/dL Final   LDL Cholesterol 10/22/2020 121 (A)  0 - 99 mg/dL Final   Comment:        Total Cholesterol/HDL:CHD Risk Coronary Heart Disease Risk Table                     Men   Women  1/2 Average Risk   3.4   3.3  Average Risk       5.0   4.4  2 X Average Risk   9.6   7.1  3 X Average Risk  23.4   11.0        Use the calculated Patient Ratio above and the CHD Risk Table to determine the patient's CHD Risk.        ATP III CLASSIFICATION (LDL):  <100     mg/dL   Optimal  622-297  mg/dL   Near or Above                    Optimal  130-159  mg/dL   Borderline  989-211  mg/dL   High  >941     mg/dL   Very High Performed at [facility], [address], [place], [place] [address]    TSH 10/22/2020 0.610  0.350 - 4.500 uIU/mL Final   Comment: Performed by a 3rd  Generation assay with a functional sensitivity of <=0.01 uIU/mL. Performed at [facility], [address] [place], [place] [address]   Admission on 10/22/2020, Discharged on 10/22/2020  Component Date Value Ref Range Status   Sodium 10/22/2020 136  135 - 145 mmol/L Final   Potassium  10/22/2020 3.9  3.5 - 5.1 mmol/L Final   Chloride 10/22/2020 103  98 - 111 mmol/L Final   CO2 10/22/2020 25  22 - 32 mmol/L Final   Glucose, Bld 10/22/2020 109 (A)  70 - 99 mg/dL Final   Glucose reference range applies only to samples taken after fasting for at least 8 hours.   BUN 10/22/2020 17  6 - 20 mg/dL Final   Creatinine, Ser 10/22/2020 0.83  0.61 - 1.24 mg/dL Final   Calcium 91/47/8295 9.1  8.9 - 10.3 mg/dL Final   Total Protein 62/13/0865 7.6  6.5 - 8.1 g/dL Final   Albumin 78/46/9629 4.6  3.5 - 5.0 g/dL Final   AST 52/84/1324 25  15 - 41 U/L Final   ALT 10/22/2020 34  0 - 44 U/L Final   Alkaline Phosphatase 10/22/2020 43  38 - 126 U/L Final   Total Bilirubin 10/22/2020 1.1  0.3 - 1.2 mg/dL Final   GFR, Estimated 10/22/2020 >60  >60 mL/min Final   Comment: (NOTE) Calculated using the CKD-EPI Creatinine Equation (2021)    Anion gap 10/22/2020 8  5 - 15 Final   Performed at [facility], [address], [place], [place] [address]   Acetaminophen (Tylenol), Serum 10/22/2020 <10 (A)  10 - 30 ug/mL Final   Comment: (NOTE) Therapeutic concentrations vary significantly. A range of 10-30 ug/mL  may be an effective concentration for many patients. However, some  are best treated at concentrations outside of this range. Acetaminophen concentrations >150 ug/mL at 4 hours after ingestion  and >50 ug/mL at 12 hours after ingestion are often associated with  toxic reactions.  Performed at [facility], [address], [place], [place] [address]    Alcohol, Ethyl (B) 10/22/2020 <10  <10 mg/dL Final   Comment: (NOTE) Lowest detectable limit for serum alcohol is 10 mg/dL.  For medical purposes only. Performed at [facility], [address], [place], [place] [address]    Salicylate Lvl 10/22/2020 <7.0 (A)  7.0 - 30.0 mg/dL Final   Performed at [facility], [address], [place], [place] [address]   WBC  10/22/2020 18.2 (A)  4.0 - 10.5 K/uL Final   RBC 10/22/2020 4.52  4.22 - 5.81 MIL/uL Final   Hemoglobin 10/22/2020 13.2  13.0 - 17.0 g/dL Final   HCT 95/63/8756 38.5 (A)  39.0 - 52.0 % Final   MCV 10/22/2020 85.2  80.0 - 100.0 fL Final   MCH 10/22/2020 29.2  26.0 - 34.0 pg Final   MCHC 10/22/2020 34.3  30.0 - 36.0 g/dL Final   RDW 43/32/9518 12.9  11.5 - 15.5 % Final   Platelets 10/22/2020 226  150 - 400 K/uL Final   nRBC 10/22/2020 0.0  0.0 - 0.2 % Final   Neutrophils Relative % 10/22/2020 84  % Final   Neutro Abs 10/22/2020 15.3 (A)  1.7 - 7.7 K/uL Final   Lymphocytes Relative 10/22/2020 9  % Final   Lymphs Abs 10/22/2020 1.6  0.7 - 4.0 K/uL Final   Monocytes Relative 10/22/2020 7  % Final   Monocytes Absolute 10/22/2020 1.2 (A)  0.1 - 1.0 K/uL  Final   Eosinophils Relative 10/22/2020 0  % Final   Eosinophils Absolute 10/22/2020 0.0  0.0 - 0.5 K/uL Final   Basophils Relative 10/22/2020 0  % Final   Basophils Absolute 10/22/2020 0.0  0.0 - 0.1 K/uL Final   Immature Granulocytes 10/22/2020 0  % Final   Abs Immature Granulocytes 10/22/2020 0.07  0.00 - 0.07 K/uL Final   Performed at [facility], [address] [place], [place] [address]   Opiates 10/22/2020 NONE DETECTED  NONE DETECTED Final   Cocaine 10/22/2020 NONE DETECTED  NONE DETECTED Final   Benzodiazepines 10/22/2020 NONE DETECTED  NONE DETECTED Final   Amphetamines 10/22/2020 NONE DETECTED  NONE DETECTED Final   Tetrahydrocannabinol 10/22/2020 NONE DETECTED  NONE DETECTED Final   Barbiturates 10/22/2020 NONE DETECTED  NONE DETECTED Final   Comment: (NOTE) DRUG SCREEN FOR MEDICAL PURPOSES ONLY.  IF CONFIRMATION IS NEEDED FOR ANY PURPOSE, NOTIFY LAB WITHIN 5 DAYS.  LOWEST DETECTABLE LIMITS FOR URINE DRUG SCREEN Drug Class                     Cutoff (ng/mL) Amphetamine and metabolites    1000 Barbiturate and metabolites    200 Benzodiazepine                 200 Tricyclics and metabolites      300 Opiates and metabolites        300 Cocaine and metabolites        300 THC                            50 Performed at [facility], [address], [place], [place] [address]    SARS Coronavirus 2 by RT PCR 10/22/2020 NEGATIVE  NEGATIVE Final   Comment: (NOTE) SARS-CoV-2 target nucleic acids are NOT DETECTED.  The SARS-CoV-2 RNA is generally detectable in upper respiratory specimens during the acute phase of infection. The lowest concentration of SARS-CoV-2 viral copies this assay can detect is 138 copies/mL. A negative result does not preclude SARS-Cov-2 infection and should not be used as the sole basis for treatment or other patient management decisions. A negative result may occur with  improper specimen collection/handling, submission of specimen other than nasopharyngeal swab, presence of viral mutation(s) within the areas targeted by this assay, and inadequate number of viral copies(<138 copies/mL). A negative result must be combined with clinical observations, patient history, and epidemiological information. The expected result is Negative.  Fact Sheet for Patients:  BloggerCourse.com  Fact Sheet for Healthcare Providers:  SeriousBroker.it  This test is no                          t yet approved or cleared by the [place] FDA and  has been authorized for detection and/or diagnosis of SARS-CoV-2 by FDA under an Emergency Use Authorization (EUA). This EUA will remain  in effect (meaning this test can be used) for the duration of the COVID-19 declaration under Section 564(b)(1) of the Act, 21 U.S.C.section 360bbb-3(b)(1), unless the authorization is terminated  or revoked sooner.       Influenza A by PCR 10/22/2020 NEGATIVE  NEGATIVE Final   Influenza B by PCR 10/22/2020 NEGATIVE  NEGATIVE Final   Comment: (NOTE) The Xpert Xpress SARS-CoV-2/FLU/RSV plus assay is intended as an aid in the  diagnosis of influenza from Nasopharyngeal swab specimens and should not  be used as a sole basis for treatment. Nasal washings and aspirates are unacceptable for Xpert Xpress SARS-CoV-2/FLU/RSV testing.  Fact Sheet for Patients: BloggerCourse.com  Fact Sheet for Healthcare Providers: SeriousBroker.it  This test is not yet approved or cleared by the [place] FDA and has been authorized for detection and/or diagnosis of SARS-CoV-2 by FDA under an Emergency Use Authorization (EUA). This EUA will remain in effect (meaning this test can be used) for the duration of the COVID-19 declaration under Section 564(b)(1) of the Act, 21 U.S.C. section 360bbb-3(b)(1), unless the authorization is terminated or revoked.  Performed at [facility], [address], [place], [place] [address]     Blood Alcohol level:  Lab Results  Component Value Date   ETH <10 10/22/2020    Metabolic Disorder Labs: Lab Results  Component Value Date   HGBA1C 5.4 10/22/2020   MPG 108.28 10/22/2020   No results found for: PROLACTIN Lab Results  Component Value Date   CHOL 180 10/22/2020   TRIG 57 10/22/2020   HDL 48 10/22/2020   CHOLHDL 3.8 10/22/2020   VLDL 11 10/22/2020   LDLCALC 121 (H) 10/22/2020    Therapeutic Lab Levels: No results found for: LITHIUM No results found for: VALPROATE No components found for:  CBMZ  Physical Findings   GAD-7    Flowsheet Row Office Visit from 08/29/2020 in [facility]  Total GAD-7 Score 2      PHQ2-9    Flowsheet Row Office Visit from 08/29/2020 in [facility]  PHQ-2 Total Score 0      Flowsheet Row ED from 12/28/2020 in [facility] Most recent reading at 12/28/2020  6:08 PM ED from 12/28/2020 in [facility] Most recent reading at 12/28/2020  2:11 AM ED from  10/22/2020 in [facility] Most recent reading at 10/22/2020  8:50 AM  C-SSRS RISK CATEGORY High Risk High Risk Low Risk        Musculoskeletal  Strength & Muscle Tone: within normal limits Gait & Station: normal Patient leans: N/A  Psychiatric Specialty Exam  Presentation  General Appearance: Appropriate for Environment; Well Groomed  Eye Contact:Good  Speech:Clear and Coherent; Normal Rate  Speech Volume:Normal  Handedness:Right   Mood and Affect  Mood:Depressed  Affect:Congruent   Thought Process  Thought Processes:Coherent; Goal Directed  Descriptions of Associations:Intact  Orientation:Full (Time, Place and Person)  Thought Content:WDL; Logical  Diagnosis of Schizophrenia or Schizoaffective disorder in past: No    Hallucinations:Hallucinations: None  Ideas of Reference:None  Suicidal Thoughts:Suicidal Thoughts: No  Homicidal Thoughts:Homicidal Thoughts: No   Sensorium  Memory:Immediate Good; Recent Good; Remote Good  Judgment:Fair  Insight:Fair   Executive Functions  Concentration:Good  Attention Span:Good  Recall:Good  Fund of Knowledge:Fair  Language:Good   Psychomotor Activity  Psychomotor Activity:Psychomotor Activity: Normal   Assets  Assets:Communication Skills; Desire for Improvement; Financial Resources/Insurance; Physical Health   Sleep  Sleep:Sleep: Poor Number of Hours of Sleep: 2   Nutritional Assessment (For [facility] and [facility] admissions only) Has the patient had a weight loss or gain of 10 pounds or more in the last 3 months?: No Has the patient had a decrease in food intake/or appetite?: No Does the patient have dental problems?: No Does the patient have eating habits or behaviors that may be indicators of an eating disorder including binging or inducing vomiting?: No Has the patient recently lost weight without trying?: 0 Has the  patient been eating poorly because of a decreased appetite?:  0 Malnutrition Screening Tool Score: 0    Physical Exam  Physical Exam Psychiatric:        Attention and Perception: Attention and perception normal. He does not perceive auditory or visual hallucinations.        Mood and Affect: Affect normal. Mood is anxious and depressed.        Speech: Speech normal.        Behavior: Behavior normal. Behavior is cooperative.        Thought Content: Thought content normal. Thought content is not paranoid. Thought content does not include homicidal or suicidal ideation.        Cognition and Memory: Cognition and memory normal.        Judgment: Judgment normal.   Review of Systems  Psychiatric/Behavioral:  Positive for depression and substance abuse. Negative for hallucinations and suicidal ideas. The patient is nervous/anxious and has insomnia.   Blood pressure 127/83, pulse 96, temperature 98.8 F (37.1 C), resp. rate 18, SpO2 99 %. There is no height or weight on file to calculate BMI.  Treatment Plan Summary: Daily contact with patient to assess and evaluate symptoms and progress in treatment, Medication management, and Plan Due to patient currently experiencing withdrawal symptoms in addition to his depressed mood and anxiety, patient to continue to remain at [facility].  Patient will continue to be evaluated until the improvement of his mood as well as cessation or lack of withdrawal symptoms.  Provider to provide resources prior to discharge of patient.  Meta Hatchet, PA 12/30/2020 10:03 PM

## 2020-12-30 NOTE — Progress Notes (Signed)
Pt utilizing the phone.

## 2020-12-30 NOTE — ED Notes (Signed)
Pt on phone with friend stating, "I'm feeling cheerio. I'm ready to get out of this place. I'm good. It's time to go". Will continue to monitor for safety.

## 2020-12-30 NOTE — Progress Notes (Signed)
Pt just now off of phone and reports he is going to bed and try to sleep.

## 2020-12-30 NOTE — Progress Notes (Signed)
Report to include Situation, Background, Assessment, and Recommendations received from [facility] Patient alert and oriented, warm and dry, in no acute distress. Patient denies SI, HI, AVH and pain. Patient made aware of Q15 minute rounds and security cameras for their safety. Patient instructed to come to me with needs or concerns.

## 2020-12-30 NOTE — ED Notes (Signed)
Pt requested list of [place] house vacancies in [place]. RN gave a list of vacancies in [place]. Pt on phone seeking housing. Will continue to monitor for safety.

## 2020-12-31 DIAGNOSIS — F329 Major depressive disorder, single episode, unspecified: Secondary | ICD-10-CM | POA: Diagnosis not present

## 2020-12-31 DIAGNOSIS — Z9151 Personal history of suicidal behavior: Secondary | ICD-10-CM | POA: Diagnosis not present

## 2020-12-31 DIAGNOSIS — F1121 Opioid dependence, in remission: Secondary | ICD-10-CM | POA: Diagnosis not present

## 2020-12-31 DIAGNOSIS — F1994 Other psychoactive substance use, unspecified with psychoactive substance-induced mood disorder: Secondary | ICD-10-CM | POA: Diagnosis not present

## 2020-12-31 MED ORDER — TRAZODONE HCL 50 MG PO TABS
50.0000 mg | ORAL_TABLET | Freq: Once | ORAL | Status: AC
  Administered 2020-12-31: 50 mg via ORAL
  Filled 2020-12-31: qty 1

## 2020-12-31 MED ORDER — SERTRALINE HCL 100 MG PO TABS
100.0000 mg | ORAL_TABLET | Freq: Every day | ORAL | Status: DC
  Administered 2021-01-01: 100 mg via ORAL
  Filled 2020-12-31 (×3): qty 1
  Filled 2020-12-31: qty 7
  Filled 2020-12-31: qty 1

## 2020-12-31 MED ORDER — SERTRALINE HCL 50 MG PO TABS
50.0000 mg | ORAL_TABLET | Freq: Once | ORAL | Status: AC
  Administered 2020-12-31: 50 mg via ORAL
  Filled 2020-12-31: qty 1

## 2020-12-31 NOTE — ED Notes (Signed)
Patient alert and verbal. Patient is taking medications as prescribed. Patient denies SI/HI and A/V/H. Patient has been out of room and in milieu interacting with staff and peer. Patient given support and encouragement. Q 15 minute checks in progress and patient remains safe on unit. Monitoring continues.

## 2020-12-31 NOTE — ED Provider Notes (Incomplete)
Behavioral Health Progress Note  Date and Time: 12/31/2020 5:16 PM Name: Zachary Freeman MRN:  [id number]  Subjective:  ***  Zachary Freeman is a 35 year old male with a past psychiatric history significant for depression, substance-induced mood disorder and opioid use disorder who was admitted to [facility] due substance induced mood disorder.  Patient was seen and reevaluated face-to-face by this provider.  Patient reports that he is still depressed. He also endorses nausea as well as high levels of anxiety accompanied by restlessness. Patient rates his anxiety an 8/10. Patient reports that he has 3 phone interviews with various sober living facilities. Patient was recommended adjusting his dosage of sertraline from 50 mg to 100 mg daily for the management of his ongoing depression and anxiety. Patient was agreeable to recommendation. Patient to be given an additional 50 mg dose of sertraline and order for sertraline 100 mg daily starting tomorrow to be initiated.  Pa  Diagnosis:  Final diagnoses:  Substance induced mood disorder (HCC)  Opioid use disorder, severe, in early remission (HCC)    Total Time spent with patient: 15 minutes  Past Psychiatric History: *** Past Medical History: No past medical history on file. No past surgical history on file. Family History: No family history on file. Family Psychiatric  History: *** Social History:  Social History   Substance and Sexual Activity  Alcohol Use Not on file     Social History   Substance and Sexual Activity  Drug Use Not on file    Social History   Socioeconomic History   Marital status: Single    Spouse name: Not on file   Number of children: Not on file   Years of education: Not on file   Highest education level: Not on file  Occupational History   Not on file  Tobacco Use   Smoking status: Some Days    Packs/day: 0.50    Years: 10.00    Pack  years: 5.00    Types: Cigarettes   Smokeless tobacco: Never  Substance and Sexual Activity   Alcohol use: Not on file   Drug use: Not on file   Sexual activity: Not on file  Other Topics Concern   Not on file  Social History Narrative   Not on file   Social Determinants of Health   Financial Resource Strain: Not on file  Food Insecurity: Not on file  Transportation Needs: Not on file  Physical Activity: Not on file  Stress: Not on file  Social Connections: Not on file   SDOH:  SDOH Screenings   Alcohol Screen: Not on file  Depression (PHQ2-9): Low Risk    PHQ-2 Score: 0  Financial Resource Strain: Not on file  Food Insecurity: Not on file  Housing: Not on file  Physical Activity: Not on file  Social Connections: Not on file  Stress: Not on file  Tobacco Use: High Risk   Smoking Tobacco Use: Some Days   Smokeless Tobacco Use: Never   Passive Exposure: Not on file  Transportation Needs: Not on file   Additional Social History:                         Sleep: {BHH GOOD/FAIR/POOR:22877}  Appetite:  {BHH GOOD/FAIR/POOR:22877}  Current Medications:  Current Facility-Administered Medications  Medication Dose Route Frequency Provider Last Rate Last Admin   acetaminophen (TYLENOL) tablet 650 mg  650 mg Oral Q6H PRN Lenard Lance,  FNP       alum & mag hydroxide-simeth (MAALOX/MYLANTA) 200-200-20 MG/5ML suspension 30 mL  30 mL Oral Q4H PRN Lenard Lance, FNP       dicyclomine (BENTYL) tablet 20 mg  20 mg Oral Q6H PRN Estella Husk, MD       gabapentin (NEURONTIN) capsule 100 mg  100 mg Oral TID Lenard Lance, FNP   100 mg at 12/31/20 1517   hydrOXYzine (ATARAX/VISTARIL) tablet 25 mg  25 mg Oral TID PRN Lenard Lance, FNP   25 mg at 12/31/20 1053   loperamide (IMODIUM) capsule 2-4 mg  2-4 mg Oral PRN Estella Husk, MD       magnesium hydroxide (MILK OF MAGNESIA) suspension 30 mL  30 mL Oral Daily PRN Lenard Lance, FNP       methocarbamol (ROBAXIN)  tablet 750 mg  750 mg Oral Q8H PRN Estella Husk, MD   750 mg at 12/31/20 0913   naproxen (NAPROSYN) tablet 500 mg  500 mg Oral BID PRN Estella Husk, MD       ondansetron (ZOFRAN-ODT) disintegrating tablet 4 mg  4 mg Oral Q6H PRN Estella Husk, MD       [START ON 01/01/2021] sertraline (ZOLOFT) tablet 100 mg  100 mg Oral Daily Gerline Ratto E, PA       traZODone (DESYREL) tablet 50 mg  50 mg Oral QHS PRN Lenard Lance, FNP   50 mg at 12/30/20 2104   Current Outpatient Medications  Medication Sig Dispense Refill   gabapentin (NEURONTIN) 100 MG capsule Take 1 capsule (100 mg total) by mouth 3 (three) times daily. 90 capsule 0   guanFACINE (INTUNIV) 1 MG TB24 ER tablet Take 1 tablet (1 mg total) by mouth in the morning. (Patient not taking: Reported on 12/28/2020) 30 tablet 1   hydrOXYzine (ATARAX/VISTARIL) 25 MG tablet Take 1 tablet (25 mg total) by mouth 3 (three) times daily as needed for anxiety. 90 tablet 0   sertraline (ZOLOFT) 100 MG tablet Take 0.5 tablets (50 mg total) by mouth daily. (Patient taking differently: Take 100 mg by mouth daily.) 30 tablet 0    Labs  Lab Results:  Admission on 12/28/2020, Discharged on 12/28/2020  Component Date Value Ref Range Status   WBC 12/28/2020 18.6 (A)  4.0 - 10.5 K/uL Final   RBC 12/28/2020 5.18  4.22 - 5.81 MIL/uL Final   Hemoglobin 12/28/2020 14.8  13.0 - 17.0 g/dL Final   HCT 16/60/6004 43.5  39.0 - 52.0 % Final   MCV 12/28/2020 84.0  80.0 - 100.0 fL Final   MCH 12/28/2020 28.6  26.0 - 34.0 pg Final   MCHC 12/28/2020 34.0  30.0 - 36.0 g/dL Final   RDW 59/97/7414 12.0  11.5 - 15.5 % Final   Platelets 12/28/2020 264  150 - 400 K/uL Final   nRBC 12/28/2020 0.0  0.0 - 0.2 % Final   Neutrophils Relative % 12/28/2020 82  % Final   Neutro Abs 12/28/2020 15.3 (A)  1.7 - 7.7 K/uL Final   Lymphocytes Relative 12/28/2020 11  % Final   Lymphs Abs 12/28/2020 2.1  0.7 - 4.0 K/uL Final   Monocytes Relative 12/28/2020 6  % Final    Monocytes Absolute 12/28/2020 1.1 (A)  0.1 - 1.0 K/uL Final   Eosinophils Relative 12/28/2020 0  % Final   Eosinophils Absolute 12/28/2020 0.0  0.0 - 0.5 K/uL Final   Basophils Relative 12/28/2020 0  %  Final   Basophils Absolute 12/28/2020 0.0  0.0 - 0.1 K/uL Final   Immature Granulocytes 12/28/2020 1  % Final   Abs Immature Granulocytes 12/28/2020 0.09 (A)  0.00 - 0.07 K/uL Final   Performed at [facility], [address], [place], [place] [address]   Sodium 12/28/2020 136  135 - 145 mmol/L Final   Potassium 12/28/2020 4.0  3.5 - 5.1 mmol/L Final   Chloride 12/28/2020 99  98 - 111 mmol/L Final   CO2 12/28/2020 29  22 - 32 mmol/L Final   Glucose, Bld 12/28/2020 129 (A)  70 - 99 mg/dL Final   Glucose reference range applies only to samples taken after fasting for at least 8 hours.   BUN 12/28/2020 21 (A)  6 - 20 mg/dL Final   Creatinine, Ser 12/28/2020 0.85  0.61 - 1.24 mg/dL Final   Calcium 96/78/9381 9.5  8.9 - 10.3 mg/dL Final   Total Protein 01/75/1025 8.2 (A)  6.5 - 8.1 g/dL Final   Albumin 85/27/7824 4.8  3.5 - 5.0 g/dL Final   AST 23/53/6144 35  15 - 41 U/L Final   ALT 12/28/2020 73 (A)  0 - 44 U/L Final   Alkaline Phosphatase 12/28/2020 48  38 - 126 U/L Final   Total Bilirubin 12/28/2020 0.9  0.3 - 1.2 mg/dL Final   GFR, Estimated 12/28/2020 >60  >60 mL/min Final   Comment: (NOTE) Calculated using the CKD-EPI Creatinine Equation (2021)    Anion gap 12/28/2020 8  5 - 15 Final   Performed at [facility], [address], [place], [place] [address]   Salicylate Lvl 12/28/2020 <7.0 (A)  7.0 - 30.0 mg/dL Final   Performed at [facility], [address] [place], [place] [address]   Acetaminophen (Tylenol), Serum 12/28/2020 <10 (A)  10 - 30 ug/mL Final   Comment: (NOTE) Therapeutic concentrations vary significantly. A range of 10-30 ug/mL  may be an effective concentration for many patients. However, some  are best treated  at concentrations outside of this range. Acetaminophen concentrations >150 ug/mL at 4 hours after ingestion  and >50 ug/mL at 12 hours after ingestion are often associated with  toxic reactions.  Performed at [facility], [address], [place], [place] [address]    Color, Urine 12/28/2020 YELLOW  YELLOW Final   APPearance 12/28/2020 HAZY (A)  CLEAR Final   Specific Gravity, Urine 12/28/2020 1.026  1.005 - 1.030 Final   pH 12/28/2020 6.0  5.0 - 8.0 Final   Glucose, UA 12/28/2020 NEGATIVE  NEGATIVE mg/dL Final   Hgb urine dipstick 12/28/2020 NEGATIVE  NEGATIVE Final   Bilirubin Urine 12/28/2020 NEGATIVE  NEGATIVE Final   Ketones, ur 12/28/2020 5 (A)  NEGATIVE mg/dL Final   Protein, ur 32/67/1245 NEGATIVE  NEGATIVE mg/dL Final   Nitrite 80/99/8338 NEGATIVE  NEGATIVE Final   Leukocytes,Ua 12/28/2020 TRACE (A)  NEGATIVE Final   RBC / HPF 12/28/2020 0-5  0 - 5 RBC/hpf Final   WBC, UA 12/28/2020 21-50  0 - 5 WBC/hpf Final   Bacteria, UA 12/28/2020 RARE (A)  NONE SEEN Final   Squamous Epithelial / LPF 12/28/2020 0-5  0 - 5 Final   Mucus 12/28/2020 PRESENT   Final   Performed at [facility], [address], [place], [place] [address]   Opiates 12/28/2020 POSITIVE (A)  NONE DETECTED Final   Cocaine 12/28/2020 NONE DETECTED  NONE DETECTED Final   Benzodiazepines 12/28/2020 POSITIVE (  A)  NONE DETECTED Final   Amphetamines 12/28/2020 NONE DETECTED  NONE DETECTED Final   Tetrahydrocannabinol 12/28/2020 NONE DETECTED  NONE DETECTED Final   Barbiturates 12/28/2020 NONE DETECTED  NONE DETECTED Final   Comment: (NOTE) DRUG SCREEN FOR MEDICAL PURPOSES ONLY.  IF CONFIRMATION IS NEEDED FOR ANY PURPOSE, NOTIFY LAB WITHIN 5 DAYS.  LOWEST DETECTABLE LIMITS FOR URINE DRUG SCREEN Drug Class                     Cutoff (ng/mL) Amphetamine and metabolites    1000 Barbiturate and metabolites    200 Benzodiazepine                 200 Tricyclics and metabolites      300 Opiates and metabolites        300 Cocaine and metabolites        300 THC                            50 Performed at [facility], [address] [place], [place] [address]    SARS Coronavirus 2 by RT PCR 12/28/2020 NEGATIVE  NEGATIVE Final   Comment: (NOTE) SARS-CoV-2 target nucleic acids are NOT DETECTED.  The SARS-CoV-2 RNA is generally detectable in upper respiratory specimens during the acute phase of infection. The lowest concentration of SARS-CoV-2 viral copies this assay can detect is 138 copies/mL. A negative result does not preclude SARS-Cov-2 infection and should not be used as the sole basis for treatment or other patient management decisions. A negative result may occur with  improper specimen collection/handling, submission of specimen other than nasopharyngeal swab, presence of viral mutation(s) within the areas targeted by this assay, and inadequate number of viral copies(<138 copies/mL). A negative result must be combined with clinical observations, patient history, and epidemiological information. The expected result is Negative.  Fact Sheet for Patients:  BloggerCourse.com  Fact Sheet for Healthcare Providers:  SeriousBroker.it  This test is no                          t yet approved or cleared by the [place] FDA and  has been authorized for detection and/or diagnosis of SARS-CoV-2 by FDA under an Emergency Use Authorization (EUA). This EUA will remain  in effect (meaning this test can be used) for the duration of the COVID-19 declaration under Section 564(b)(1) of the Act, 21 U.S.C.section 360bbb-3(b)(1), unless the authorization is terminated  or revoked sooner.       Influenza A by PCR 12/28/2020 NEGATIVE  NEGATIVE Final   Influenza B by PCR 12/28/2020 NEGATIVE  NEGATIVE Final   Comment: (NOTE) The Xpert Xpress SARS-CoV-2/FLU/RSV plus assay is intended as an aid in the  diagnosis of influenza from Nasopharyngeal swab specimens and should not be used as a sole basis for treatment. Nasal washings and aspirates are unacceptable for Xpert Xpress SARS-CoV-2/FLU/RSV testing.  Fact Sheet for Patients: BloggerCourse.com  Fact Sheet for Healthcare Providers: SeriousBroker.it  This test is not yet approved or cleared by the [place] FDA and has been authorized for detection and/or diagnosis of SARS-CoV-2 by FDA under an Emergency Use Authorization (EUA). This EUA will remain in effect (meaning this test can be used) for the duration of the COVID-19 declaration under Section 564(b)(1) of the Act, 21 U.S.C. section 360bbb-3(b)(1), unless the authorization is terminated or revoked.  Performed at [facility], [address], [place], [place] [address]   Admission on 10/22/2020, Discharged on 10/23/2020  Component Date Value Ref Range Status   Hgb A1c MFr Bld 10/22/2020 5.4  4.8 - 5.6 % Final   Comment: (NOTE) Pre diabetes:          5.7%-6.4%  Diabetes:              >6.4%  Glycemic control for   <7.0% adults with diabetes    Mean Plasma Glucose 10/22/2020 108.28  mg/dL Final   Performed at [facility], [address], [place], [place] [address]   Cholesterol 10/22/2020 180  0 - 200 mg/dL Final   Triglycerides 56/21/3086 57  <150 mg/dL Final   HDL 57/84/6962 48  >40 mg/dL Final   Total CHOL/HDL Ratio 10/22/2020 3.8  RATIO Final   VLDL 10/22/2020 11  0 - 40 mg/dL Final   LDL Cholesterol 10/22/2020 121 (A)  0 - 99 mg/dL Final   Comment:        Total Cholesterol/HDL:CHD Risk Coronary Heart Disease Risk Table                     Men   Women  1/2 Average Risk   3.4   3.3  Average Risk       5.0   4.4  2 X Average Risk   9.6   7.1  3 X Average Risk  23.4   11.0        Use the calculated Patient Ratio above and the CHD Risk Table to determine the patient's CHD Risk.         ATP III CLASSIFICATION (LDL):  <100     mg/dL   Optimal  952-841  mg/dL   Near or Above                    Optimal  130-159  mg/dL   Borderline  324-401  mg/dL   High  >027     mg/dL   Very High Performed at [facility], [address], [place], [place] [address]    TSH 10/22/2020 0.610  0.350 - 4.500 uIU/mL Final   Comment: Performed by a 3rd Generation assay with a functional sensitivity of <=0.01 uIU/mL. Performed at [facility], [address] [place], [place] [address]   Admission on 10/22/2020, Discharged on 10/22/2020  Component Date Value Ref Range Status   Sodium 10/22/2020 136  135 - 145 mmol/L Final   Potassium 10/22/2020 3.9  3.5 - 5.1 mmol/L Final   Chloride 10/22/2020 103  98 - 111 mmol/L Final   CO2 10/22/2020 25  22 - 32 mmol/L Final   Glucose, Bld 10/22/2020 109 (A)  70 - 99 mg/dL Final   Glucose reference range applies only to samples taken after fasting for at least 8 hours.   BUN 10/22/2020 17  6 - 20 mg/dL Final   Creatinine, Ser 10/22/2020 0.83  0.61 - 1.24 mg/dL Final   Calcium 74/25/9563 9.1  8.9 - 10.3 mg/dL Final   Total Protein 87/56/4332 7.6  6.5 - 8.1 g/dL Final   Albumin 95/18/8416 4.6  3.5 - 5.0 g/dL Final   AST 60/63/0160 25  15 - 41 U/L Final   ALT 10/22/2020 34  0 - 44 U/L Final   Alkaline Phosphatase 10/22/2020 43  38 - 126 U/L Final   Total Bilirubin 10/22/2020 1.1  0.3 - 1.2 mg/dL  Final   GFR, Estimated 10/22/2020 >60  >60 mL/min Final   Comment: (NOTE) Calculated using the CKD-EPI Creatinine Equation (2021)    Anion gap 10/22/2020 8  5 - 15 Final   Performed at [facility], [address] [place], [place] [address]   Acetaminophen (Tylenol), Serum 10/22/2020 <10 (A)  10 - 30 ug/mL Final   Comment: (NOTE) Therapeutic concentrations vary significantly. A range of 10-30 ug/mL  may be an effective concentration for many patients. However, some  are best treated at concentrations outside of  this range. Acetaminophen concentrations >150 ug/mL at 4 hours after ingestion  and >50 ug/mL at 12 hours after ingestion are often associated with  toxic reactions.  Performed at [facility], [address], [place], [place] [address]    Alcohol, Ethyl (B) 10/22/2020 <10  <10 mg/dL Final   Comment: (NOTE) Lowest detectable limit for serum alcohol is 10 mg/dL.  For medical purposes only. Performed at [facility], [address] [place], [place] [address]    Salicylate Lvl 10/22/2020 <7.0 (A)  7.0 - 30.0 mg/dL Final   Performed at [facility], [address], [place], [place] [address]   WBC 10/22/2020 18.2 (A)  4.0 - 10.5 K/uL Final   RBC 10/22/2020 4.52  4.22 - 5.81 MIL/uL Final   Hemoglobin 10/22/2020 13.2  13.0 - 17.0 g/dL Final   HCT 93/81/0175 38.5 (A)  39.0 - 52.0 % Final   MCV 10/22/2020 85.2  80.0 - 100.0 fL Final   MCH 10/22/2020 29.2  26.0 - 34.0 pg Final   MCHC 10/22/2020 34.3  30.0 - 36.0 g/dL Final   RDW 12/26/8525 12.9  11.5 - 15.5 % Final   Platelets 10/22/2020 226  150 - 400 K/uL Final   nRBC 10/22/2020 0.0  0.0 - 0.2 % Final   Neutrophils Relative % 10/22/2020 84  % Final   Neutro Abs 10/22/2020 15.3 (A)  1.7 - 7.7 K/uL Final   Lymphocytes Relative 10/22/2020 9  % Final   Lymphs Abs 10/22/2020 1.6  0.7 - 4.0 K/uL Final   Monocytes Relative 10/22/2020 7  % Final   Monocytes Absolute 10/22/2020 1.2 (A)  0.1 - 1.0 K/uL Final   Eosinophils Relative 10/22/2020 0  % Final   Eosinophils Absolute 10/22/2020 0.0  0.0 - 0.5 K/uL Final   Basophils Relative 10/22/2020 0  % Final   Basophils Absolute 10/22/2020 0.0  0.0 - 0.1 K/uL Final   Immature Granulocytes 10/22/2020 0  % Final   Abs Immature Granulocytes 10/22/2020 0.07  0.00 - 0.07 K/uL Final   Performed at [facility], [address], [place], [place] [address]   Opiates 10/22/2020 NONE DETECTED  NONE DETECTED Final   Cocaine  10/22/2020 NONE DETECTED  NONE DETECTED Final   Benzodiazepines 10/22/2020 NONE DETECTED  NONE DETECTED Final   Amphetamines 10/22/2020 NONE DETECTED  NONE DETECTED Final   Tetrahydrocannabinol 10/22/2020 NONE DETECTED  NONE DETECTED Final   Barbiturates 10/22/2020 NONE DETECTED  NONE DETECTED Final   Comment: (NOTE) DRUG SCREEN FOR MEDICAL PURPOSES ONLY.  IF CONFIRMATION IS NEEDED FOR ANY PURPOSE, NOTIFY LAB WITHIN 5 DAYS.  LOWEST DETECTABLE LIMITS FOR URINE DRUG SCREEN Drug Class                     Cutoff (ng/mL) Amphetamine and metabolites    1000 Barbiturate and metabolites    200 Benzodiazepine  200 Tricyclics and metabolites     300 Opiates and metabolites        300 Cocaine and metabolites        300 THC                            50 Performed at [facility], [address] [place], [place] [address]    SARS Coronavirus 2 by RT PCR 10/22/2020 NEGATIVE  NEGATIVE Final   Comment: (NOTE) SARS-CoV-2 target nucleic acids are NOT DETECTED.  The SARS-CoV-2 RNA is generally detectable in upper respiratory specimens during the acute phase of infection. The lowest concentration of SARS-CoV-2 viral copies this assay can detect is 138 copies/mL. A negative result does not preclude SARS-Cov-2 infection and should not be used as the sole basis for treatment or other patient management decisions. A negative result may occur with  improper specimen collection/handling, submission of specimen other than nasopharyngeal swab, presence of viral mutation(s) within the areas targeted by this assay, and inadequate number of viral copies(<138 copies/mL). A negative result must be combined with clinical observations, patient history, and epidemiological information. The expected result is Negative.  Fact Sheet for Patients:  BloggerCourse.com  Fact Sheet for Healthcare Providers:  SeriousBroker.it  This  test is no                          t yet approved or cleared by the [place] FDA and  has been authorized for detection and/or diagnosis of SARS-CoV-2 by FDA under an Emergency Use Authorization (EUA). This EUA will remain  in effect (meaning this test can be used) for the duration of the COVID-19 declaration under Section 564(b)(1) of the Act, 21 U.S.C.section 360bbb-3(b)(1), unless the authorization is terminated  or revoked sooner.       Influenza A by PCR 10/22/2020 NEGATIVE  NEGATIVE Final   Influenza B by PCR 10/22/2020 NEGATIVE  NEGATIVE Final   Comment: (NOTE) The Xpert Xpress SARS-CoV-2/FLU/RSV plus assay is intended as an aid in the diagnosis of influenza from Nasopharyngeal swab specimens and should not be used as a sole basis for treatment. Nasal washings and aspirates are unacceptable for Xpert Xpress SARS-CoV-2/FLU/RSV testing.  Fact Sheet for Patients: BloggerCourse.com  Fact Sheet for Healthcare Providers: SeriousBroker.it  This test is not yet approved or cleared by the [place] FDA and has been authorized for detection and/or diagnosis of SARS-CoV-2 by FDA under an Emergency Use Authorization (EUA). This EUA will remain in effect (meaning this test can be used) for the duration of the COVID-19 declaration under Section 564(b)(1) of the Act, 21 U.S.C. section 360bbb-3(b)(1), unless the authorization is terminated or revoked.  Performed at [facility], [address] [place], [place] [address]     Blood Alcohol level:  Lab Results  Component Value Date   ETH <10 10/22/2020    Metabolic Disorder Labs: Lab Results  Component Value Date   HGBA1C 5.4 10/22/2020   MPG 108.28 10/22/2020   No results found for: PROLACTIN Lab Results  Component Value Date   CHOL 180 10/22/2020   TRIG 57 10/22/2020   HDL 48 10/22/2020   CHOLHDL 3.8 10/22/2020   VLDL 11 10/22/2020    LDLCALC 121 (H) 10/22/2020    Therapeutic Lab Levels: No results found for: LITHIUM No results found for: VALPROATE No components found for:  CBMZ  Physical Findings  GAD-7    Flowsheet Row [facility] Visit [facility] 08/29/2020 in [facility]  Total GAD-7 Score 2      PHQ2-9    Flowsheet Row [facility] Visit [facility] 08/29/2020 in [facility]  PHQ-2 Total Score 0      Flowsheet Row ED from 12/28/2020 in [facility] Most recent reading at 12/28/2020  6:08 PM ED from 12/28/2020 in [facility] Most recent reading at 12/28/2020  2:11 AM ED from 10/22/2020 in [facility] Most recent reading at 10/22/2020  8:50 AM  C-SSRS RISK CATEGORY High Risk High Risk Low Risk        Musculoskeletal  Strength & Muscle Tone: {desc; muscle tone:32375} Gait & Station: {PE GAIT ED KXFG:18299} Patient leans: {Patient Leans:21022755}  Psychiatric Specialty Exam  Presentation  General Appearance: Appropriate for Environment  Eye Contact:Good  Speech:Clear and Coherent  Speech Volume:Normal  Handedness:Right   Mood and Affect  Mood:Depressed  Affect:Congruent; Depressed   Thought Process  Thought Processes:Coherent; Goal Directed  Descriptions of Associations:Intact  Orientation:Full (Time, Place and Person)  Thought Content:Logical; WDL  Diagnosis of Schizophrenia or Schizoaffective disorder in past: No    Hallucinations:Hallucinations: None  Ideas of Reference:None  Suicidal Thoughts:Suicidal Thoughts: No  Homicidal Thoughts:Homicidal Thoughts: No   Sensorium  Memory:Immediate Good; Recent Good; Remote Good  Judgment:Fair  Insight:Fair   Executive Functions  Concentration:Good  Attention Span:Good  Recall:Good  Fund of Knowledge:Fair  Language:Good   Psychomotor Activity  Psychomotor Activity:Psychomotor Activity:  Normal   Assets  Assets:Communication Skills; Desire for Improvement; Financial Resources/Insurance; Physical Health   Sleep  Sleep:Sleep: Good Number of Hours of Sleep: 2   Nutritional Assessment (For OBS and FBC admissions only) Has the patient had a weight loss or gain of 10 pounds or more in the last 3 months?: No Has the patient had a decrease in food intake/or appetite?: Yes Does the patient have dental problems?: No Does the patient have eating habits or behaviors that may be indicators of an eating disorder including binging or inducing vomiting?: No Has the patient recently lost weight without trying?: 0 Has the patient been eating poorly because of a decreased appetite?: 0 Malnutrition Screening Tool Score: 0    Physical Exam  Physical Exam Psychiatric:        Attention and Perception: Attention and perception normal. He does not perceive auditory or visual hallucinations.        Mood and Affect: Affect normal. Mood is anxious and depressed.        Speech: Speech normal.        Behavior: Behavior normal. Behavior is cooperative.        Thought Content: Thought content normal. Thought content does not include homicidal or suicidal ideation. Thought content does not include suicidal plan.        Cognition and Memory: Cognition and memory normal.        Judgment: Judgment normal.   Review of Systems  Psychiatric/Behavioral:  Positive for depression. Negative for hallucinations, substance abuse and suicidal ideas. The patient is nervous/anxious. The patient does not have insomnia.   Blood pressure 126/78, pulse 68, temperature (!) 97.5 F (36.4 C), temperature source Temporal, resp. rate 18, SpO2 97 %. There is no height or weight on file to calculate BMI.  Treatment Plan Summary: {CHL [facility] MD TX. BZJI:[id number]}  Meta Hatchet, PA 12/31/2020 5:16 PM

## 2020-12-31 NOTE — Progress Notes (Signed)
Pt pleasant and cooperative with care.  Compliant with medications as ordered.  He reports that he has been accepted at the [facility] located on [address] in [place].  His girlfriend is scheduled to pick him up at noon tomorrow and will be providing transportation to the [facility].  Pt verbalized some anxiety related to discharge but reports that he is ready to go.  Pt reports he is grateful and appreciative of the care provided to him by this service.

## 2020-12-31 NOTE — ED Notes (Signed)
Patient is on the phone looking for placement. Monitoring continues.

## 2020-12-31 NOTE — ED Notes (Signed)
Patient in room lying on bed awake. Respirations even and non labored. No distress noted. Monitoring continues.

## 2020-12-31 NOTE — Progress Notes (Signed)
Pt remains in bed with even and unlabored respirations.

## 2021-01-01 DIAGNOSIS — F329 Major depressive disorder, single episode, unspecified: Secondary | ICD-10-CM | POA: Diagnosis not present

## 2021-01-01 DIAGNOSIS — Z9151 Personal history of suicidal behavior: Secondary | ICD-10-CM | POA: Diagnosis not present

## 2021-01-01 DIAGNOSIS — F1121 Opioid dependence, in remission: Secondary | ICD-10-CM | POA: Diagnosis not present

## 2021-01-01 DIAGNOSIS — F1994 Other psychoactive substance use, unspecified with psychoactive substance-induced mood disorder: Secondary | ICD-10-CM | POA: Diagnosis not present

## 2021-01-01 MED ORDER — GABAPENTIN 100 MG PO CAPS: 100.0000 mg | ORAL_CAPSULE | Freq: Three times a day (TID) | ORAL | 1 refills | Status: DC

## 2021-01-01 MED ORDER — SERTRALINE HCL 100 MG PO TABS: 100.0000 mg | ORAL_TABLET | Freq: Every day | ORAL | 1 refills | Status: DC

## 2021-01-01 MED ORDER — HYDROXYZINE HCL 25 MG PO TABS: 25.0000 mg | ORAL_TABLET | Freq: Three times a day (TID) | ORAL | 0 refills | Status: DC | PRN

## 2021-01-01 MED ORDER — TRAZODONE HCL 50 MG PO TABS: 50.0000 mg | ORAL_TABLET | Freq: Every evening | ORAL | 1 refills | Status: DC | PRN

## 2021-01-01 NOTE — ED Provider Notes (Signed)
FBC/OBS ASAP Discharge Summary  Date and Time: 01/01/2021 12:20 PM  Name: Zachary Freeman  MRN:  [id number]   Discharge Diagnoses:  Final diagnoses:  Substance induced mood disorder (HCC)  Opioid use disorder, severe, in early remission (HCC)    Subjective:  Patient seen and chart reviewed-patient has been compliant with medication, has been appropriate with staff and peers on the unit and has not been a management issue.  Patient interviewed this morning in the caf area.  Patient displays a bright affect, he is calm and cooperative and pleasant throughout interview.  Patient states that he is feeling "a whole lot better" than when he first came in.  Patient denies SI/HI/AVH.  Patient states that he did not sleep very well last night but attributes this to anxiety about leaving today.  Patient states that he will have his friend pick him up today and retrieve his belongings from the sober living house where he was previously and plans to go to day [employer].  Patient denies all physical symptoms and verbalizes readiness for discharge.  All questions and concerns were addressed.  Stay Summary:  35 year old male with history of depression and substance use who presented to [facility], ED on 10/27 with SI in the context of getting kicked out of his halfway house due to using fentanyl. UDS+bzd and opiates.  Patient was transferred to the [facility] for further treatment the same day.  Started on his previous medications of Zoloft 50 mg and gabapentin 100 mg 3 times daily.  He was also started on opiate withdrawal protocol.  On 10/30 Zoloft was increased to 100 mg daily-patient tolerated the increase well.  On 10/31, day of discharge, patient denied SI/HI/AVH and reported that he was ready for discharge-the above for additional information.    Total Time spent with patient: 15 minutes  Past Psychiatric History: see H&P Past Medical History: No past medical history on file. No past surgical history on  file. Family History: No family history on file. Family Psychiatric History: see H&P Social History:  Social History   Substance and Sexual Activity  Alcohol Use Not on file     Social History   Substance and Sexual Activity  Drug Use Not on file    Social History   Socioeconomic History   Marital status: Single    Spouse name: Not on file   Number of children: Not on file   Years of education: Not on file   Highest education level: Not on file  Occupational History   Not on file  Tobacco Use   Smoking status: Some Days    Packs/day: 0.50    Years: 10.00    Pack years: 5.00    Types: Cigarettes   Smokeless tobacco: Never  Substance and Sexual Activity   Alcohol use: Not on file   Drug use: Not on file   Sexual activity: Not on file  Other Topics Concern   Not on file  Social History Narrative   Not on file   Social Determinants of Health   Financial Resource Strain: Not on file  Food Insecurity: Not on file  Transportation Needs: Not on file  Physical Activity: Not on file  Stress: Not on file  Social Connections: Not on file   SDOH:  SDOH Screenings   Alcohol Screen: Not on file  Depression (PHQ2-9): Low Risk    PHQ-2 Score: 0  Financial Resource Strain: Not on file  Food Insecurity: Not on file  Housing: Not  on file  Physical Activity: Not on file  Social Connections: Not on file  Stress: Not on file  Tobacco Use: High Risk   Smoking Tobacco Use: Some Days   Smokeless Tobacco Use: Never   Passive Exposure: Not on file  Transportation Needs: Not on file    Tobacco Cessation:  n/a  Current Medications:  Current Facility-Administered Medications  Medication Dose Route Frequency Provider Last Rate Last Admin   acetaminophen (TYLENOL) tablet 650 mg  650 mg Oral Q6H PRN Lenard Lance, FNP       alum & mag hydroxide-simeth (MAALOX/MYLANTA) 200-200-20 MG/5ML suspension 30 mL  30 mL Oral Q4H PRN Lenard Lance, FNP       dicyclomine (BENTYL) tablet  20 mg  20 mg Oral Q6H PRN Estella Husk, MD       gabapentin (NEURONTIN) capsule 100 mg  100 mg Oral TID Lenard Lance, FNP   100 mg at 01/01/21 0272   hydrOXYzine (ATARAX/VISTARIL) tablet 25 mg  25 mg Oral TID PRN Lenard Lance, FNP   25 mg at 01/01/21 5366   loperamide (IMODIUM) capsule 2-4 mg  2-4 mg Oral PRN Estella Husk, MD       magnesium hydroxide (MILK OF MAGNESIA) suspension 30 mL  30 mL Oral Daily PRN Lenard Lance, FNP       methocarbamol (ROBAXIN) tablet 750 mg  750 mg Oral Q8H PRN Estella Husk, MD   750 mg at 12/31/20 2102   naproxen (NAPROSYN) tablet 500 mg  500 mg Oral BID PRN Estella Husk, MD   500 mg at 12/31/20 2059   ondansetron (ZOFRAN-ODT) disintegrating tablet 4 mg  4 mg Oral Q6H PRN Estella Husk, MD       sertraline (ZOLOFT) tablet 100 mg  100 mg Oral Daily Nwoko, Uchenna E, PA   100 mg at 01/01/21 0905   traZODone (DESYREL) tablet 50 mg  50 mg Oral QHS PRN Lenard Lance, FNP   50 mg at 12/31/20 2100   Current Outpatient Medications  Medication Sig Dispense Refill   gabapentin (NEURONTIN) 100 MG capsule Take 1 capsule (100 mg total) by mouth 3 (three) times daily. 90 capsule 1   hydrOXYzine (ATARAX/VISTARIL) 25 MG tablet Take 1 tablet (25 mg total) by mouth 3 (three) times daily as needed for anxiety. 90 tablet 0   sertraline (ZOLOFT) 100 MG tablet Take 1 tablet (100 mg total) by mouth daily. 30 tablet 1   traZODone (DESYREL) 50 MG tablet Take 1 tablet (50 mg total) by mouth at bedtime as needed for sleep. 30 tablet 1    PTA Medications: (Not in a hospital admission)   Musculoskeletal  Strength & Muscle Tone: within normal limits Gait & Station: normal Patient leans: N/A  Psychiatric Specialty Exam  Presentation  General Appearance: Appropriate for Environment; Casual  Eye Contact:Good  Speech:Clear and Coherent; Normal Rate  Speech Volume:Normal  Handedness:Right   Mood and Affect   Mood:Anxious  Affect:Appropriate; Congruent   Thought Process  Thought Processes:Coherent; Goal Directed; Linear  Descriptions of Associations:Intact  Orientation:Full (Time, Place and Person)  Thought Content:WDL; Logical  Diagnosis of Schizophrenia or Schizoaffective disorder in past: No    Hallucinations:Hallucinations: None  Ideas of Reference:None  Suicidal Thoughts:Suicidal Thoughts: No  Homicidal Thoughts:Homicidal Thoughts: No   Sensorium  Memory:Immediate Good; Recent Good; Remote Good  Judgment:Fair  Insight:Fair   Executive Functions  Concentration:Good  Attention Span:Good  Recall:Good  Fund of Knowledge:Good  Language:Good   Psychomotor Activity  Psychomotor Activity:Psychomotor Activity: Normal   Assets  Assets:Communication Skills; Desire for Improvement; Financial Resources/Insurance; Physical Health   Sleep  Sleep:Sleep: Fair   Nutritional Assessment (For OBS and FBC admissions only) Has the patient had a weight loss or gain of 10 pounds or more in the last 3 months?: No Has the patient had a decrease in food intake/or appetite?: Yes Does the patient have dental problems?: No Does the patient have eating habits or behaviors that may be indicators of an eating disorder including binging or inducing vomiting?: No Has the patient recently lost weight without trying?: 0 Has the patient been eating poorly because of a decreased appetite?: 0 Malnutrition Screening Tool Score: 0   Physical Exam    See SRA for physical Exam and ROS  See SRA for suicide risk assessment   Blood pressure 124/76, pulse 94, temperature 97.8 F (36.6 C), temperature source Tympanic, resp. rate 16, SpO2 100 %. There is no height or weight on file to calculate BMI.    Plan Of Care/Follow-up recommendations:  Activity:  as tolerated Diet:  regular Other:    Patient is instructed prior to discharge to: Take all medications as prescribed by his/her  mental healthcare provider. Report any adverse effects and or reactions from the medicines to his/her outpatient provider promptly. Patient has been instructed & cautioned: To not engage in alcohol and or illegal drug use while on prescription medicines. In the event of worsening symptoms, patient is instructed to call the crisis hotline, 911 and or go to the nearest ED for appropriate evaluation and treatment of symptoms. To follow-up with his/her primary care provider for your other medical issues, concerns and or health care needs.   TAKE these medications    gabapentin 100 MG capsule Commonly known as: NEURONTIN Take 1 capsule (100 mg total) by mouth 3 (three) times daily.   hydrOXYzine 25 MG tablet Commonly known as: ATARAX/VISTARIL Take 1 tablet (25 mg total) by mouth 3 (three) times daily as needed for anxiety.   sertraline 100 MG tablet Commonly known as: ZOLOFT Take 1 tablet (100 mg total) by mouth daily.   traZODone 50 MG tablet Commonly known as: DESYREL Take 1 tablet (50 mg total) by mouth at bedtime as needed for sleep.       Patient was provided with a 30-day paper scripts with 1 refill for above medications.  Patient was also provided with 7 day samples prior to discharge and requested to wait for samples in the lobby.  Disposition:  Discharge to self care. Patient plans to stay with a friend and then go to [facility] tomorrow morning for rehab.   Estella Husk, MD 01/01/2021, 12:20 PM

## 2021-01-01 NOTE — Clinical Social Work Psych Note (Signed)
CSW Discharge Note   CSW met with Kingsten to discuss any additional questions or concerns that may have developed over the weekend.   Eriberto shared that he felt much better today, and that he was hoping to discharge. He denied having any SI, HI or AVH. He denied any other symptoms at this time, as well.    Gable reports he no longer plans to return to his [facility] bed, however he plans to discharge to an [facility] for additional transitional living services.   Jaydee also reports he changed is mind regarding substance abuse treatment. He requested a referral be sent to [facility] for residential substance abuse treatment.   CSW sent Faizan's clinical information to [facility] admission coordinator [facility] via e-mail. CSW provided [facility] with the patient's contact information. Arman ensured he will continue to follow up regarding a possible bed following his discharge.   Lennard reports his girlfriend is picking him up and he plans to stay with a "friend" tonight, until he goes to [facility].    Dr. Sherrye Payor, MD notified  CSW will continue to follow until discharge.    Radonna Ricker, MSW, LCSW Clinical Education officer, museum ([place]) [facility]

## 2021-01-01 NOTE — ED Provider Notes (Signed)
[facility] Discharge Suicide Risk Assessment   Principal Problem: Substance induced mood disorder (HCC) Discharge Diagnoses: Principal Problem:   Substance induced mood disorder (HCC) Active Problems:   MDD (major depressive disorder), recurrent, in full remission (HCC)   Total Time spent with patient: 15 minutes  Musculoskeletal: Strength & Muscle Tone: within normal limits Gait & Station: normal Patient leans: N/A  Psychiatric Specialty Exam  Presentation  General Appearance: Appropriate for Environment; Casual  Eye Contact:Good  Speech:Clear and Coherent; Normal Rate  Speech Volume:Normal  Handedness:Right   Mood and Affect  Mood:Anxious  Duration of Depression Symptoms: Greater than two weeks  Affect:Appropriate; Congruent   Thought Process  Thought Processes:Coherent; Goal Directed; Linear  Descriptions of Associations:Intact  Orientation:Full (Time, Place and Person)  Thought Content:WDL; Logical  History of Schizophrenia/Schizoaffective disorder:No  Duration of Psychotic Symptoms:No data recorded Hallucinations:Hallucinations: None  Ideas of Reference:None  Suicidal Thoughts:Suicidal Thoughts: No  Homicidal Thoughts:Homicidal Thoughts: No   Sensorium  Memory:Immediate Good; Recent Good; Remote Good  Judgment:Fair  Insight:Fair   Executive Functions  Concentration:Good  Attention Span:Good  Recall:Good  Fund of Knowledge:Good  Language:Good   Psychomotor Activity  Psychomotor Activity:Psychomotor Activity: Normal   Assets  Assets:Communication Skills; Desire for Improvement; Financial Resources/Insurance; Physical Health   Sleep  Sleep:Sleep: Fair   Physical Exam: Physical Exam Constitutional:      Appearance: Normal appearance. He is normal weight.  HENT:     Head: Normocephalic and atraumatic.  Eyes:     Extraocular Movements: Extraocular movements intact.  Pulmonary:     Effort: Pulmonary effort is normal.   Neurological:     General: No focal deficit present.     Mental Status: He is alert.   Review of Systems  Constitutional:  Negative for chills and fever.  HENT:  Negative for hearing loss.   Eyes:  Negative for discharge and redness.  Respiratory:  Negative for cough.   Cardiovascular:  Negative for chest pain.  Gastrointestinal:  Negative for abdominal pain.  Musculoskeletal:  Negative for myalgias.  Neurological:  Negative for headaches.  Psychiatric/Behavioral:  Positive for substance abuse. Negative for depression, hallucinations and suicidal ideas.   Blood pressure 124/76, pulse 94, temperature 97.8 F (36.6 C), temperature source Tympanic, resp. rate 16, SpO2 100 %. There is no height or weight on file to calculate BMI.  Mental Status Per Nursing Assessment::   On Admission:   SI; currently denies  Demographic Factors:  Male, Caucasian, and Low socioeconomic status  Loss Factors: Decrease in vocational status  Historical Factors: Prior suicide attempts and history of inpatient hospitalization  Risk Reduction Factors:   Living with another person, especially a relative, Positive social support, and future oriented  Continued Clinical Symptoms:  Depression:   Comorbid alcohol abuse/dependence Alcohol/Substance Abuse/Dependencies  Cognitive Features That Contribute To Risk:  Thought constriction (tunnel vision)    Suicide Risk:  Minimal: No identifiable suicidal ideation.  Patients presenting with no risk factors but with morbid ruminations; may be classified as minimal risk based on the severity of the depressive symptoms   Follow-up Information     [facility]. Go to.   Specialty: Behavioral Health Why: Pleae go during walk-in hours to establish outpatient psychiatric services, such as medication management and outpatient therapy.   Medication Management Walk-In Hours: Monday-Friday from 8:00am-11:00am. Please arrive by  7:30am-7:45am, as patients are seen on a first come, first served basis.   Therapy Walk-In Hours: Monday-Wednesday from 8:00am-until slots are filled. Please arrive by  7:30am-7:45am, as patients are seen on a first come, first served basis.   Friday hours are 1:00pm-5:00pm Contact information: [address] [place] [address] [phone]        [facility]. Call.   Why: Please contact for any outpatient substance use services. Be sure to have any discharge paperwork from this encounter. Contact information: [address] [place], [place] [address] Office: [phone]  Fax: [phone]                Plan Of Care/Follow-up recommendations:  Activity:  as tolerated Diet:  regular Other:     Patient is instructed prior to discharge to: Take all medications as prescribed by his/her mental healthcare provider. Report any adverse effects and or reactions from the medicines to his/her outpatient provider promptly. Patient has been instructed & cautioned: To not engage in alcohol and or illegal drug use while on prescription medicines. In the event of worsening symptoms, patient is instructed to call the crisis hotline, 911 and or go to the nearest ED for appropriate evaluation and treatment of symptoms. To follow-up with his/her primary care provider for your other medical issues, concerns and or health care needs.   TAKE these medications    gabapentin 100 MG capsule Commonly known as: NEURONTIN Take 1 capsule (100 mg total) by mouth 3 (three) times daily.   hydrOXYzine 25 MG tablet Commonly known as: ATARAX/VISTARIL Take 1 tablet (25 mg total) by mouth 3 (three) times daily as needed for anxiety.   sertraline 100 MG tablet Commonly known as: ZOLOFT Take 1 tablet (100 mg total) by mouth daily.   traZODone 50 MG tablet Commonly known as: DESYREL Take 1 tablet (50 mg total) by mouth at bedtime as needed for sleep.       Patient was  provided with a 30-day paper scripts with 1 refill for above medications.  Patient was also provided with 7 day samples prior to discharge and requested to wait for samples in the lobby.  Estella Husk, MD 01/01/2021, 12:09 PM

## 2021-01-01 NOTE — ED Notes (Signed)
Patient A&O x 4, ambulatory. Patient discharged in no acute distress. Patient denied SI/HI, A/VH upon discharge. Patient verbalized understanding of all discharge instructions explained by staff, to include follow up appointments, RX's and safety plan. Pt belongings returned to patient from lockers #2 & 4 intact. Patient escorted to lobby via staff for transport to destination. Safety maintained.

## 2021-01-01 NOTE — ED Notes (Signed)
Pt eating breakfast 

## 2021-01-01 NOTE — ED Notes (Signed)
Pt walking around unit requesting to leave today by noon. Pt states, "My girlfriend will be here at 12 to pick me up. I have to be at the [facility] by 12". Provider made aware of pt's request to discharge via secure chat. Pt denies SI/HI/AVH. Denies withdrawal sx from opioid abuse. Informed pt to notify staff with any needs or concerns. Will continue to monitor for safety.

## 2021-01-01 NOTE — Discharge Instructions (Signed)
Take all medications as prescribed by his/her mental healthcare provider. Report any adverse effects and or reactions from the medicines to his/her outpatient provider promptly. Do not engage in alcohol and or illegal drug use while on prescription medicines. In the event of worsening symptoms, patient is instructed to call the crisis hotline, 911 and or go to the nearest ED for appropriate evaluation and treatment of symptoms. To follow-up with his/her primary care provider for your other medical issues, concerns and or health care needs.    

## 2021-01-01 NOTE — Progress Notes (Signed)
Pt asleep in bed, respirations even and unlabored.

## 2021-01-02 ENCOUNTER — Telehealth (HOSPITAL_COMMUNITY): Payer: Self-pay

## 2021-01-02 NOTE — BH Assessment (Signed)
Care Management - Follow Up [facility] Discharges   Writer made contact with the patient. Patient reports that he is currently receiving at [facility].  Patient reports that he is in the process of setting up an appt to receive services with [facility].

## 2021-01-10 ENCOUNTER — Emergency Department (HOSPITAL_COMMUNITY)
Admission: EM | Admit: 2021-01-10 | Discharge: 2021-01-10 | Disposition: A | Discharge status: Home / Self Care | Payer: 59 | Attending: Emergency Medicine | Admitting: Emergency Medicine

## 2021-01-10 ENCOUNTER — Emergency Department (HOSPITAL_COMMUNITY): Payer: 59

## 2021-01-10 ENCOUNTER — Encounter (HOSPITAL_COMMUNITY): Payer: Self-pay | Admitting: Emergency Medicine

## 2021-01-10 DIAGNOSIS — S46911A Strain of unspecified muscle, fascia and tendon at shoulder and upper arm level, right arm, initial encounter: Secondary | ICD-10-CM | POA: Diagnosis not present

## 2021-01-10 DIAGNOSIS — W11XXXA Fall on and from ladder, initial encounter: Secondary | ICD-10-CM | POA: Diagnosis not present

## 2021-01-10 DIAGNOSIS — Y939 Activity, unspecified: Secondary | ICD-10-CM | POA: Insufficient documentation

## 2021-01-10 DIAGNOSIS — S4991XA Unspecified injury of right shoulder and upper arm, initial encounter: Secondary | ICD-10-CM | POA: Diagnosis present

## 2021-01-10 DIAGNOSIS — Y999 Unspecified external cause status: Secondary | ICD-10-CM | POA: Diagnosis not present

## 2021-01-10 DIAGNOSIS — F17210 Nicotine dependence, cigarettes, uncomplicated: Secondary | ICD-10-CM | POA: Insufficient documentation

## 2021-01-10 DIAGNOSIS — Y929 Unspecified place or not applicable: Secondary | ICD-10-CM | POA: Diagnosis not present

## 2021-01-10 MED ORDER — NAPROXEN 500 MG PO TABS: 500.0000 mg | ORAL_TABLET | Freq: Two times a day (BID) | ORAL | 0 refills | Status: AC

## 2021-01-10 NOTE — ED Provider Notes (Signed)
[facility] EMERGENCY DEPARTMENT Provider Note   CSN: [id number] Arrival date & time: 01/10/21  1208     History Chief Complaint  Patient presents with   Marletta Lor    Zachary Freeman is a 35 y.o. male.  35 year old male presents with complaint of pain in his right shoulder.  Patient states that he was about 6 feet off the ground on a ladder when he fell off of the ladder landing on his right shoulder in the grass.  No loss of consciousness, did not hit his head.  Has been ambulatory since accident that difficulty.  Denies neck or back pain, injury to his lower extremities or left arm.  Pain in the right shoulder is worse with range of motion, no history of prior injuries to the shoulder.      History reviewed. No pertinent past medical history.  Patient Active Problem List   Diagnosis Date Noted   Substance induced mood disorder (HCC) 10/22/2020   MDD (major depressive disorder), recurrent, in full remission (HCC) 08/29/2020   Opioid use disorder, severe, in early remission (HCC) 08/29/2020   Stimulant use disorder, in early remission 08/29/2020   Hepatitis C test positive 08/29/2020    No past surgical history on file.     No family history on file.  Social History   Tobacco Use   Smoking status: Some Days    Packs/day: 0.50    Years: 10.00    Pack years: 5.00    Types: Cigarettes   Smokeless tobacco: Never    Home Medications Prior to Admission medications   Medication Sig Start Date End Date Taking? Authorizing Provider  naproxen (NAPROSYN) 500 MG tablet Take 1 tablet (500 mg total) by mouth 2 (two) times daily for 10 days. 01/10/21 01/20/21 Yes Jeannie Fend, PA-C  gabapentin (NEURONTIN) 100 MG capsule Take 1 capsule (100 mg total) by mouth 3 (three) times daily. 01/01/21   Estella Husk, MD  hydrOXYzine (ATARAX/VISTARIL) 25 MG tablet Take 1 tablet (25 mg total) by mouth 3 (three) times daily as needed for anxiety. 01/01/21   Estella Husk, MD  sertraline (ZOLOFT) 100 MG tablet Take 1 tablet (100 mg total) by mouth daily. 01/01/21   Estella Husk, MD  traZODone (DESYREL) 50 MG tablet Take 1 tablet (50 mg total) by mouth at bedtime as needed for sleep. 01/01/21   Estella Husk, MD    Allergies    Patient has no known allergies.  Review of Systems   Review of Systems  Constitutional:  Negative for fever.  Respiratory:  Negative for shortness of breath.   Cardiovascular:  Negative for chest pain.  Gastrointestinal:  Negative for abdominal pain, nausea and vomiting.  Musculoskeletal:  Positive for arthralgias. Negative for back pain, gait problem, neck pain and neck stiffness.  Skin:  Negative for color change, rash and wound.  Allergic/Immunologic: Negative for immunocompromised state.  Neurological:  Negative for weakness and numbness.  Hematological:  Does not bruise/bleed easily.  Psychiatric/Behavioral:  Negative for confusion.   All other systems reviewed and are negative.  Physical Exam Updated Vital Signs BP (!) 147/96   Pulse 96   Temp 98.2 F (36.8 C)   Resp 19   SpO2 99%   Physical Exam Vitals and nursing note reviewed.  Constitutional:      General: He is not in acute distress.    Appearance: He is well-developed. He is not diaphoretic.  HENT:  Head: Normocephalic and atraumatic.  Cardiovascular:     Pulses: Normal pulses.  Pulmonary:     Effort: Pulmonary effort is normal.  Musculoskeletal:        General: Tenderness present. No swelling or deformity.     Cervical back: Normal. No tenderness or bony tenderness.     Thoracic back: No tenderness or bony tenderness.     Lumbar back: No tenderness or bony tenderness.     Comments: Generalized right shoulder tenderness, pain worse with range of motion.  No crepitus.  Sensation intact, strong radial pulse present.  Skin:    General: Skin is warm and dry.     Findings: No erythema or rash.  Neurological:     Mental  Status: He is alert and oriented to person, place, and time.     Sensory: No sensory deficit.     Motor: No weakness.  Psychiatric:        Behavior: Behavior normal.    ED Results / Procedures / Treatments   Labs (all labs ordered are listed, but only abnormal results are displayed) Labs Reviewed - No data to display  EKG None  Radiology DG Shoulder Right  Result Date: 01/10/2021 CLINICAL DATA:  Trauma, fall, pain EXAM: RIGHT SHOULDER - 2+ VIEW COMPARISON:  None. FINDINGS: There is no evidence of fracture or dislocation. There is no evidence of arthropathy or other focal bone abnormality. Soft tissues are unremarkable. IMPRESSION: No fracture or dislocation is seen. Electronically Signed   By: Ernie Avena M.D.   On: 01/10/2021 12:51    Procedures Procedures   Medications Ordered in ED Medications - No data to display  ED Course  I have reviewed the triage vital signs and the nursing notes.  Pertinent labs & imaging results that were available during my care of the patient were reviewed by me and considered in my medical decision making (see chart for details).  Clinical Course as of 01/10/21 1307  Wed Jan 10, 2021  357 35 year old male with right shoulder pain after fall from ladder as above.  On exam, generalized tenderness, exam otherwise reassuring.  X-ray negative for fracture.  Recommend course of NSAID, follow-up with orthopedics if not improving. [LM]    Clinical Course User Index [LM] Alden Hipp   MDM Rules/Calculators/A&P                           Final Clinical Impression(s) / ED Diagnoses Final diagnoses:  Strain of right shoulder, initial encounter    Rx / DC Orders ED Discharge Orders          Ordered    naproxen (NAPROSYN) 500 MG tablet  2 times daily        01/10/21 1303             Jeannie Fend, PA-C 01/10/21 1307    Benjiman Core, MD 01/10/21 1920

## 2021-01-10 NOTE — ED Triage Notes (Signed)
Patient here after falling approximately six feet off of a ladder and landing in grass on his right shoulder. No LOC, denies pain anywhere other than right shoulder. Patient alert, oriented, ambulatory, and in no apparent distress at this time.

## 2021-01-10 NOTE — ED Notes (Signed)
Pt is in X-Ray and will be transported to [place] [facility] once finished.

## 2021-01-10 NOTE — Discharge Instructions (Signed)
Take naproxen as prescribed for pain and complete the full course.  Recommend gentle range of motion exercises.  Can ice your shoulder for 20 minutes at a time.  Follow-up with orthopedics if pain continues.

## 2021-04-02 ENCOUNTER — Encounter (HOSPITAL_COMMUNITY): Payer: Self-pay | Admitting: Psychiatry

## 2021-04-02 ENCOUNTER — Ambulatory Visit (INDEPENDENT_AMBULATORY_CARE_PROVIDER_SITE_OTHER): Payer: 59 | Admitting: Psychiatry

## 2021-04-02 ENCOUNTER — Other Ambulatory Visit: Payer: Self-pay

## 2021-04-02 VITALS — BP 139/83 | HR 82 | Ht 70.0 in | Wt 210.0 lb

## 2021-04-02 DIAGNOSIS — F1121 Opioid dependence, in remission: Secondary | ICD-10-CM

## 2021-04-02 DIAGNOSIS — F1590 Other stimulant use, unspecified, uncomplicated: Secondary | ICD-10-CM

## 2021-04-02 DIAGNOSIS — F3342 Major depressive disorder, recurrent, in full remission: Secondary | ICD-10-CM | POA: Diagnosis not present

## 2021-04-02 DIAGNOSIS — F411 Generalized anxiety disorder: Secondary | ICD-10-CM

## 2021-04-02 MED ORDER — HYDROXYZINE HCL 50 MG PO TABS: 50.0000 mg | ORAL_TABLET | Freq: Three times a day (TID) | ORAL | 3 refills | Status: DC | PRN

## 2021-04-02 MED ORDER — TRAZODONE HCL 50 MG PO TABS: 50.0000 mg | ORAL_TABLET | Freq: Every evening | ORAL | 3 refills | Status: DC | PRN

## 2021-04-02 MED ORDER — GABAPENTIN 300 MG PO CAPS: 300.0000 mg | ORAL_CAPSULE | Freq: Three times a day (TID) | ORAL | 3 refills | Status: DC

## 2021-04-02 NOTE — Progress Notes (Signed)
Psychiatric Initial Adult Assessment   Patient Identification: Zachary Freeman MRN:  [id number] Date of Evaluation:  04/02/2021 Referral Source: Walk in/[facility] Chief Complaint:   Chief Complaint   WALK-IN    Visit Diagnosis:    ICD-10-CM   1. MDD (major depressive disorder), recurrent, in full remission (HCC)  F33.42 traZODone (DESYREL) 50 MG tablet    2. Opioid use disorder, severe, in early remission (HCC)  F11.21 gabapentin (NEURONTIN) 300 MG capsule    3. Stimulant use disorder, in early remission  F15.90 gabapentin (NEURONTIN) 300 MG capsule    4. Generalized anxiety disorder  F41.1 hydrOXYzine (ATARAX) 50 MG tablet    gabapentin (NEURONTIN) 300 MG capsule      History of Present Illness:  36 year old male seen today for initial psychiatric evaluation. He walked into the clinic for medication management. Recently He was seen at [facility] on 12/28/2020-1031/2022 where he presented with SI after being kicked out of his sobriety home. He was also recently admitted at [facility] for substance use. He has a psychiatric history of fentanyl abuse, stimulant use, opioid use, substance induced mood disorder, SI, and depression. Currently he is managed on Trazodone 50 mg nightly as needed, hydroxyzine 25 mg three times daily as needed, Zoloft 100 mg daily as needed (discontinued due to false positive benzo screening), and gabapentin 100 mg three times daily. He notes his medications are somewhat effective in managing his psychiatric conditions.  Today he is well groomed, pleasant, cooperative, and engaged in conversation. He informed Clinical research associate that he has been more anxious especially in the morning before her goes to work at [employer]. He notes that his mind races on the negative thing he may experience though the day. Today provider conducted a GAD 7 and patient scored a 17. Provider also conducted a PHQ 9 and patient scored a 5. He endorses adequate sleep and appetite. Today he denies  SI/HI/VAH, mania, or paranoia.  Patient notes that recently he fell off a ladder and injured his right shoulder. He informed Clinical research associate that tylenol helps manage his pain.  Patient notes that he misused Xanax and Fentanyl. He reports that he has been sober for 102 days and is enjoying his sobriety. He now lives in an [place] home and reports that he finds his fiance supportive.   Today he is agreeable to increasing Gabapentin 100 mg three times daily to 300 mg three times daily to help manage anxiety. He is also agreeable to increase hydroxyzine 25 mg three times daily to 50 mg three times daily. Patient notes that he discontinued Zoloft a few weeks ago as it showed up as a benzo in is UDS. He will continue trazodone as prescribed.   Associated Signs/Symptoms: Depression Symptoms:  depressed mood, psychomotor agitation, psychomotor retardation, difficulty concentrating, impaired memory, (Hypo) Manic Symptoms:  Distractibility, Elevated Mood, Anxiety Symptoms:  Excessive Worry, Psychotic Symptoms:   Denies PTSD Symptoms: NA  Past Psychiatric History: Depression, substance induced mood disorder, Stimulant use, SI, fentanyl abuse, and opioid use  Previous Psychotropic Medications:  Gabapentin, Zoloft (discontinued because it should up as a benzo), Hydroxyzine, Trazodone, Xanax (unprescribed)  Substance Abuse History in the last 12 months:  Yes.    Consequences of Substance Abuse: Legal Consequences:  was in jail for stealing Xanax  Past Medical History: History reviewed. No pertinent past medical history. History reviewed. No pertinent surgical history.  Family Psychiatric History: Father mental substance use (sober now), maternal grandfather alcohol use  Family History: History  reviewed. No pertinent family history.  Social History:   Social History   Socioeconomic History   Marital status: Single    Spouse name: Not on file   Number of children: Not on file   Years of education:  Not on file   Highest education level: Not on file  Occupational History   Not on file  Tobacco Use   Smoking status: Some Days    Packs/day: 0.50    Years: 10.00    Pack years: 5.00    Types: Cigarettes   Smokeless tobacco: Never  Substance and Sexual Activity   Alcohol use: Not on file   Drug use: Not on file   Sexual activity: Not on file  Other Topics Concern   Not on file  Social History Narrative   Not on file   Social Determinants of Health   Financial Resource Strain: Not on file  Food Insecurity: Not on file  Transportation Needs: Not on file  Physical Activity: Not on file  Stress: Not on file  Social Connections: Not on file    Additional Social History: [place] in [place] in a sober living home. He is engaged and has no children. He notes that he smokes a pack a day of cigarettes a day. He denies alcohol or illegal drug use.   Allergies:  No Known Allergies  Metabolic Disorder Labs: Lab Results  Component Value Date   HGBA1C 5.4 10/22/2020   MPG 108.28 10/22/2020   No results found for: PROLACTIN Lab Results  Component Value Date   CHOL 180 10/22/2020   TRIG 57 10/22/2020   HDL 48 10/22/2020   CHOLHDL 3.8 10/22/2020   VLDL 11 10/22/2020   LDLCALC 121 (H) 10/22/2020   Lab Results  Component Value Date   TSH 0.610 10/22/2020    Therapeutic Level Labs: No results found for: LITHIUM No results found for: CBMZ No results found for: VALPROATE  Current Medications: Current Outpatient Medications  Medication Sig Dispense Refill   gabapentin (NEURONTIN) 300 MG capsule Take 1 capsule (300 mg total) by mouth 3 (three) times daily. 90 capsule 3   hydrOXYzine (ATARAX) 50 MG tablet Take 1 tablet (50 mg total) by mouth 3 (three) times daily as needed for anxiety. 90 tablet 3   traZODone (DESYREL) 50 MG tablet Take 1 tablet (50 mg total) by mouth at bedtime as needed for sleep. 30 tablet 3   No current facility-administered medications for  this visit.    Musculoskeletal: Strength & Muscle Tone: within normal limits Gait & Station: normal Patient leans: N/A  Psychiatric Specialty Exam: Review of Systems  Blood pressure 139/83, pulse 82, height 5\' 10"  (1.778 m), weight 210 lb (95.3 kg).Body mass index is 30.13 kg/m.  General Appearance: Well Groomed  Eye Contact:  Good  Speech:  Clear and Coherent and Normal Rate  Volume:  Normal  Mood:  Anxious  Affect:  Appropriate and Congruent  Thought Process:  Coherent, Goal Directed, and Linear  Orientation:  Full (Time, Place, and Person)  Thought Content:  WDL and Logical  Suicidal Thoughts:  No  Homicidal Thoughts:  No  Memory:  Immediate;   Good Recent;   Good Remote;   Good  Judgement:  Good  Insight:  Good  Psychomotor Activity:  Normal  Concentration:  Concentration: Good and Attention Span: Good  Recall:  Good  Fund of Knowledge:Good  Language: Good  Akathisia:  No  Handed:  Right  AIMS (if indicated):  not done  Assets:  Communication Skills Desire for Improvement Financial Resources/Insurance Housing Intimacy Physical Health Social Support  ADL's:  Intact  Cognition: WNL  Sleep:  Good   Screenings: GAD-7    Flowsheet Row Office Visit from 08/29/2020 in [facility]  Total GAD-7 Score 2      PHQ2-9    Flowsheet Row Office Visit from 04/02/2021 in [facility] Office Visit from 08/29/2020 in [facility]  PHQ-2 Total Score 3 0  PHQ-9 Total Score 5 --      Flowsheet Row ED from 01/10/2021 in [facility] EMERGENCY DEPARTMENT Most recent reading at 01/10/2021 12:27 PM ED from 12/28/2020 in [facility] Most recent reading at 12/28/2020  6:08 PM ED from 12/28/2020 in [facility] Most recent reading at 12/28/2020  2:11 AM  C-SSRS RISK CATEGORY No Risk High Risk High Risk        Assessment and Plan: Patient endorses symptoms of anxiety. Today he is agreeable to increasing Gabapentin 100 mg three times daily to 300 mg three times daily to help manage anxiety. He is also agreeable to increase hydroxyzine 25 mg three times daily to 50 mg three times daily. Patient notes that he discontinued Zoloft a few weeks ago as it showed up as a benzo in is UDS. He will continue trazodone as prescribed.   1. Opioid use disorder, severe, in early remission (HCC)  Increased- gabapentin (NEURONTIN) 300 MG capsule; Take 1 capsule (300 mg total) by mouth 3 (three) times daily.  Dispense: 90 capsule; Refill: 3  2. Stimulant use disorder, in early remission  Increased- gabapentin (NEURONTIN) 300 MG capsule; Take 1 capsule (300 mg total) by mouth 3 (three) times daily.  Dispense: 90 capsule; Refill: 3  3. MDD (major depressive disorder), recurrent, in full remission (HCC)  Continue- traZODone (DESYREL) 50 MG tablet; Take 1 tablet (50 mg total) by mouth at bedtime as needed for sleep.  Dispense: 30 tablet; Refill: 3  4. Generalized anxiety disorder  Increased- hydrOXYzine (ATARAX) 50 MG tablet; Take 1 tablet (50 mg total) by mouth 3 (three) times daily as needed for anxiety.  Dispense: 90 tablet; Refill: 3 Increased- gabapentin (NEURONTIN) 300 MG capsule; Take 1 capsule (300 mg total) by mouth 3 (three) times daily.  Dispense: 90 capsule; Refill: 3  Follow up in 3 months   Shanna Cisco, NP 1/30/20238:45 AM

## 2021-05-01 ENCOUNTER — Telehealth: Payer: Self-pay | Admitting: Family Medicine

## 2021-05-01 DIAGNOSIS — K0889 Other specified disorders of teeth and supporting structures: Secondary | ICD-10-CM

## 2021-05-01 MED ORDER — AMOXICILLIN 500 MG PO CAPS: 500.0000 mg | ORAL_CAPSULE | Freq: Three times a day (TID) | ORAL | 0 refills | Status: AC

## 2021-05-01 NOTE — Patient Instructions (Signed)
Dental Pain Dental pain is often a sign that something is wrong with your teeth or gums. It is also something that can occur following dental treatment. If you have dental pain, it is important to contact your dental care provider, especially if the cause of the pain has not been determined. Dental pain may be of varying intensity and can be caused by many things, including: Tooth decay (cavities or caries). Cavities are caused by bacteria that produce acids that irritate the nerve of your tooth, making it sensitive to air and hot or cold temperatures. This eventually causes discomfort or pain. Abscess or infection. Once the bacteria reach the inner part of the tooth (pulp), a bacterial infection (dental abscess) can occur. Pus typically collects at the end of the root of a tooth. Injury. A crack in the tooth. Gum recession exposing the root, and possibly the nerves, of a tooth. Gum (periodontal)disease. Abnormal grinding or clenching. Poor or improper home care. An unknown reason (idiopathic). Your pain may be mild or severe. It may occur when you are: Chewing. Exposed to hot or cold temperatures. Eating or drinking sugary foods or beverages, such as soda or candy. Your pain may be constant, or it may come and go without cause. Follow these instructions at home: The following actions may help to lessen any discomfort that you are feeling before or after getting dental care. Medicines Take over-the-counter and prescription medicines only as told by your dental care provider. If you were prescribed an antibiotic medicine, take it as told by your dental care provider. Do not stop taking the antibiotic even if you start to feel better. Eating and drinking Avoid foods or drinks that cause you pain, such as: Very hot or very cold foods or drinks. Sweet or sugary foods or drinks. Managing pain and swelling  Ice can sometimes be used to reduce pain and swelling, especially if the pain is  following dental treatment. If directed, put ice on the painful area of your face. To do this: Put ice in a plastic bag. Place a towel between your skin and the bag. Leave the ice on for 20 minutes, 2-3 times a day. Remove the ice if your skin turns bright red. This is very important. If you cannot feel pain, heat, or cold, you have a greater risk of damage to the area. Brushing your teeth To keep your mouth and gums healthy, brush your teeth twice a day using a fluoride toothpaste. Use a toothpaste made for sensitive teeth as directed by your dental care provider, especially if the root is exposed. Always brush your teeth with a soft-bristled toothbrush. This will help prevent irritation to your gums. General instructions Floss at least once a day. Do not apply heat to the outside of the face. Gargle with a mixture of salt and water 3-4 times a day or as needed. To make salt water, completely dissolve -1 tsp (3-6 g) of salt in 1 cup (237 mL) of warm water. Keep all follow-up visits. This is important. Contact a dental care provider if: You have any unexplained dental pain. Your pain is not controlled with medicines. Your symptoms get worse. You have new symptoms. Get help right away if: You are unable to open your mouth. You are having trouble breathing or swallowing. You have a fever. You notice that your face, neck, or jaw is swollen. These symptoms may represent a serious problem that is an emergency. Do not wait to see if the symptoms will go   away. Get medical help right away. Call your local emergency services (911 in the [place] Do not drive yourself to the hospital. Summary Dental pain may be caused by many things, including tooth decay and infection. Your pain may be mild or severe. Take over-the-counter and prescription medicines only as told by your dental care provider. Watch your dental pain for any changes. Let your dental care provider know if your symptoms get  worse. This information is not intended to replace advice given to you by your health care provider. Make sure you discuss any questions you have with your health care provider. Document Revised: 11/24/2019 Document Reviewed: 11/24/2019 Elsevier Patient Education  2022 [employer]  

## 2021-05-01 NOTE — Progress Notes (Signed)
Virtual Visit Consent   Zachary Freeman, you are scheduled for a virtual visit with a [place] provider today.     Just as with appointments in the office, your consent must be obtained to participate.  Your consent will be active for this visit and any virtual visit you may have with one of our providers in the next 365 days.     If you have a MyChart account, a copy of this consent can be sent to you electronically.  All virtual visits are billed to your insurance company just like a traditional visit in the office.    As this is a virtual visit, video technology does not allow for your provider to perform a traditional examination.  This may limit your provider's ability to fully assess your condition.  If your provider identifies any concerns that need to be evaluated in person or the need to arrange testing (such as labs, EKG, etc.), we will make arrangements to do so.     Although advances in technology are sophisticated, we cannot ensure that it will always work on either your end or our end.  If the connection with a video visit is poor, the visit may have to be switched to a telephone visit.  With either a video or telephone visit, we are not always able to ensure that we have a secure connection.     I need to obtain your verbal consent now.   Are you willing to proceed with your visit today?    Zachary Freeman has provided verbal consent on 05/01/2021 for a virtual visit (video or telephone).   Freddy Finner, NP   Date: 05/01/2021 12:35 PM   Virtual Visit via Video Note   I, Freddy Finner, connected with  Zachary Freeman  ([id number], [birth year]) on 34/04/23 at 12:30 PM EST by a video-enabled telemedicine application and verified that I am speaking with the correct person using two identifiers.  Location: Patient: Virtual Visit Location Patient: Home Provider: Virtual Visit Location Provider: Home Office   I discussed the limitations of evaluation and management by  telemedicine and the availability of in person appointments. The patient expressed understanding and agreed to proceed.    History of Present Illness: Zachary Freeman is a 36 y.o. who identifies as a male who was assigned male at birth, and is being seen today for dental pain. Reports cracked tooth around 4 weeks back.   HPI: Dental Pain  This is a new problem. The current episode started 1 to 4 weeks ago. The problem occurs constantly. The problem has been waxing and waning. The pain is at a severity of 9/10. The pain is severe. Associated symptoms include sinus pressure and thermal sensitivity. Pertinent negatives include no difficulty swallowing, facial pain, fever or oral bleeding. Associated symptoms comments: Nausea at times. He has tried NSAIDs, acetaminophen and ice for the symptoms. The treatment provided mild relief.   Problems:  Patient Active Problem List   Diagnosis Date Noted   Substance induced mood disorder (HCC) 10/22/2020   MDD (major depressive disorder), recurrent, in full remission (HCC) 08/29/2020   Opioid use disorder, severe, in early remission (HCC) 08/29/2020   Stimulant use disorder, in early remission 08/29/2020   Hepatitis C test positive 08/29/2020    Allergies: No Known Allergies Medications:  Current Outpatient Medications:    gabapentin (NEURONTIN) 300 MG capsule, Take 1 capsule (300 mg total) by mouth 3 (three) times daily., Disp: 90 capsule, Rfl:  3   hydrOXYzine (ATARAX) 50 MG tablet, Take 1 tablet (50 mg total) by mouth 3 (three) times daily as needed for anxiety., Disp: 90 tablet, Rfl: 3   traZODone (DESYREL) 50 MG tablet, Take 1 tablet (50 mg total) by mouth at bedtime as needed for sleep., Disp: 30 tablet, Rfl: 3  Observations/Objective: Patient is well-developed, well-nourished in no acute distress.  Resting comfortably  at home.  Head is normocephalic, atraumatic.  No labored breathing.  Speech is clear and coherent with logical content.  Patient  is alert and oriented at baseline.  No noted swelling in face or neck.  Assessment and Plan:  1. Pain, dental S&S are consistent with dental infection due to cracked tooth recent in last 4 weeks. Is trying to get into dentist now, but cant get an appt for another week or more. Advised strict follow up for repair of this tooth.  - amoxicillin (AMOXIL) 500 MG capsule; Take 1 capsule (500 mg total) by mouth 3 (three) times daily for 10 days.  Dispense: 30 capsule; Refill: 0   Reviewed side effects, risks and benefits of medication.    Patient acknowledged agreement and understanding of the plan.   I discussed the assessment and treatment plan with the patient. The patient was provided an opportunity to ask questions and all were answered. The patient agreed with the plan and demonstrated an understanding of the instructions.   The patient was advised to call back or seek an in-person evaluation if the symptoms worsen or if the condition fails to improve as anticipated.   The above assessment and management plan was discussed with the patient. The patient verbalized understanding of and has agreed to the management plan. Patient is aware to call the clinic if symptoms persist or worsen. Patient is aware when to return to the clinic for a follow-up visit. Patient educated on when it is appropriate to go to the emergency department.    Follow Up Instructions: I discussed the assessment and treatment plan with the patient. The patient was provided an opportunity to ask questions and all were answered. The patient agreed with the plan and demonstrated an understanding of the instructions.  A copy of instructions were sent to the patient via MyChart unless otherwise noted below.    The patient was advised to call back or seek an in-person evaluation if the symptoms worsen or if the condition fails to improve as anticipated.  Time:  I spent 10 minutes with the patient via telehealth technology  discussing the above problems/concerns.    Freddy Finner, NP

## 2021-05-04 ENCOUNTER — Encounter: Payer: Self-pay | Admitting: Physician Assistant

## 2021-05-04 NOTE — Progress Notes (Signed)
Patient logged into video, but connection was lost immediately. Provider sent new link for patient to log back in and waited 5 minutes. No attempts made. Provider called and left VM, waited 5 more min and called again without answer. Will mark erroneous. Advised on VM he would have to reschedule.  ?

## 2021-05-28 ENCOUNTER — Encounter: Payer: Self-pay | Admitting: Family Medicine

## 2021-05-28 ENCOUNTER — Emergency Department (HOSPITAL_COMMUNITY): Payer: 59

## 2021-05-28 ENCOUNTER — Other Ambulatory Visit: Payer: Self-pay

## 2021-05-28 ENCOUNTER — Encounter (HOSPITAL_COMMUNITY): Payer: Self-pay

## 2021-05-28 ENCOUNTER — Emergency Department (HOSPITAL_COMMUNITY)
Admission: EM | Admit: 2021-05-28 | Discharge: 2021-05-29 | Discharge status: Against Medical Advice | Payer: 59 | Attending: Student | Admitting: Student

## 2021-05-28 DIAGNOSIS — R0789 Other chest pain: Secondary | ICD-10-CM | POA: Diagnosis not present

## 2021-05-28 DIAGNOSIS — Z5321 Procedure and treatment not carried out due to patient leaving prior to being seen by health care provider: Secondary | ICD-10-CM | POA: Diagnosis not present

## 2021-05-28 NOTE — ED Triage Notes (Signed)
Pt presents to ED with  c/o left sided chest pain that radiates to the back, worsens with movement. Pt denies SOB ?

## 2021-05-28 NOTE — ED Provider Triage Note (Signed)
Emergency Medicine Provider Triage Evaluation Note ? ?Zachary Freeman , a 36 y.o. male  was evaluated in triage.  Pt complains of left sided chest pain. Patient states that the pain began Friday. Radiates to left side of the back. Patient denies shortness of breath. Pain is worse with movement. Patient rested on Sunday and pain improved. Patient states pain increased while lifting and moving objects at work today ? ?Review of Systems  ?Positive: Chest pain ?Negative: Shortness of breath ? ?Physical Exam  ?There were no vitals taken for this visit. ?Gen:   Awake, no distress   ?Resp:  Normal effort  ?MSK:   Moves extremities without difficulty  ?Other:   ? ?Medical Decision Making  ?Medically screening exam initiated at 8:24 PM.  Appropriate orders placed.  Zachary Freeman was informed that the remainder of the evaluation will be completed by another provider, this initial triage assessment does not replace that evaluation, and the importance of remaining in the ED until their evaluation is complete. ? ? ?  ?Darrick Grinder, PA-C ?05/28/21 2025 ? ?

## 2021-06-11 ENCOUNTER — Encounter (HOSPITAL_COMMUNITY): Payer: 59 | Admitting: Psychiatry

## 2021-06-14 ENCOUNTER — Ambulatory Visit: Payer: 59 | Admitting: Nurse Practitioner

## 2021-06-18 ENCOUNTER — Telehealth (INDEPENDENT_AMBULATORY_CARE_PROVIDER_SITE_OTHER): Payer: 59 | Admitting: Psychiatry

## 2021-06-18 DIAGNOSIS — F3342 Major depressive disorder, recurrent, in full remission: Secondary | ICD-10-CM | POA: Diagnosis not present

## 2021-06-18 DIAGNOSIS — F411 Generalized anxiety disorder: Secondary | ICD-10-CM | POA: Diagnosis not present

## 2021-06-18 DIAGNOSIS — F1121 Opioid dependence, in remission: Secondary | ICD-10-CM | POA: Diagnosis not present

## 2021-06-18 DIAGNOSIS — F1590 Other stimulant use, unspecified, uncomplicated: Secondary | ICD-10-CM | POA: Diagnosis not present

## 2021-06-18 MED ORDER — GABAPENTIN 300 MG PO CAPS: 300.0000 mg | ORAL_CAPSULE | Freq: Three times a day (TID) | ORAL | 2 refills | Status: DC

## 2021-06-18 MED ORDER — HYDROXYZINE HCL 50 MG PO TABS: 100.0000 mg | ORAL_TABLET | Freq: Three times a day (TID) | ORAL | 2 refills | Status: DC | PRN

## 2021-06-18 MED ORDER — TRAZODONE HCL 50 MG PO TABS: 50.0000 mg | ORAL_TABLET | Freq: Every evening | ORAL | 2 refills | Status: DC | PRN

## 2021-06-18 NOTE — Progress Notes (Signed)
BH MD/PA/NP OP Progress Note ? ?06/18/2021 3:36 PM ?Zachary Freeman  ?MRN:  [id number] ? ? ?Virtual Visit via Video Note ? ?I connected with Zachary Freeman on 06/18/21 at  3:30 PM EDT by a video enabled telemedicine application and verified that I am speaking with the correct person using two identifiers. ? ?Location: ?Patient: home ?Provider: offsite ?  ?I discussed the limitations of evaluation and management by telemedicine and the availability of in person appointments. The patient expressed understanding and agreed to proceed. ? ? ?  ?I discussed the assessment and treatment plan with the patient. The patient was provided an opportunity to ask questions and all were answered. The patient agreed with the plan and demonstrated an understanding of the instructions. ?  ?The patient was advised to call back or seek an in-person evaluation if the symptoms worsen or if the condition fails to improve as anticipated. ? ?I provided 10 minutes of non-face-to-face time during this encounter. ? ? ?Franne Grip, NP  ? ?Chief Complaint: Medication management ? ?HPI: Zachary Freeman is a 36 year old male presenting to [facility] behavioral health outpatient for follow-up psychiatric evaluation.  He has a psychiatric history of major depressive disorder, generalized anxiety disorder and polysubstance use and his symptoms are managed with gabapentin 300 mg 3 times daily, hydroxyzine 50 mg 3 times daily as needed for anxiety and trazodone 50 mg at bedtime as needed for sleep.  Patient reports medication compliance but continued symptoms of anxiety.  Patient request to increase hydroxyzine, stating that sometimes he takes 2 tablets and 100 mg is effective with managing his anxiety symptoms.  Medication benefits versus risk discussed.  Hydroxyzine increased to 100 mg 3 times daily as needed for anxiety.  Patient denies adverse medication effects.  Remaining medications refilled at current dosages. ? ? ?Visit Diagnosis:  ?   ICD-10-CM   ?1. Generalized anxiety disorder  F41.1 hydrOXYzine (ATARAX) 50 MG tablet  ?  gabapentin (NEURONTIN) 300 MG capsule  ?  ?2. Opioid use disorder, severe, in early remission ([place])  F11.21 gabapentin (NEURONTIN) 300 MG capsule  ?  ?3. Stimulant use disorder, in early remission  F15.90 gabapentin (NEURONTIN) 300 MG capsule  ?  ?4. MDD (major depressive disorder), recurrent, in full remission ([place])  F33.42 traZODone (DESYREL) 50 MG tablet  ?  ? ? ?Past Psychiatric History: MDD, polysubstance use, generalized anxiety disorder ? ?Past Medical History: No past medical history on file. No past surgical history on file. ? ?Family Psychiatric History: N/A ? ?Family History: No family history on file. ? ?Social History:  ?Social History  ? ?Socioeconomic History  ? Marital status: Single  ?  Spouse name: Not on file  ? Number of children: Not on file  ? Years of education: Not on file  ? Highest education level: Not on file  ?Occupational History  ? Not on file  ?Tobacco Use  ? Smoking status: Some Days  ?  Packs/day: 0.50  ?  Years: 10.00  ?  Pack years: 5.00  ?  Types: Cigarettes  ? Smokeless tobacco: Never  ?Substance and Sexual Activity  ? Alcohol use: Yes  ? Drug use: Not Currently  ? Sexual activity: Yes  ?Other Topics Concern  ? Not on file  ?Social History Narrative  ? Not on file  ? ?Social Determinants of Health  ? ?Financial Resource Strain: Not on file  ?Food Insecurity: Not on file  ?Transportation Needs: Not on file  ?Physical Activity: Not on file  ?  Stress: Not on file  ?Social Connections: Not on file  ? ? ?Allergies: No Known Allergies ? ?Metabolic Disorder Labs: ?Lab Results  ?Component Value Date  ? HGBA1C 5.4 10/22/2020  ? MPG 108.28 10/22/2020  ? ?No results found for: PROLACTIN ?Lab Results  ?Component Value Date  ? CHOL 180 10/22/2020  ? TRIG 57 10/22/2020  ? HDL 48 10/22/2020  ? CHOLHDL 3.8 10/22/2020  ? VLDL 11 10/22/2020  ? LDLCALC 121 (H) 10/22/2020  ? ?Lab Results  ?Component Value Date   ? TSH 0.610 10/22/2020  ? ? ?Therapeutic Level Labs: ?No results found for: LITHIUM ?No results found for: VALPROATE ?No components found for:  CBMZ ? ?Current Medications: ?Current Outpatient Medications  ?Medication Sig Dispense Refill  ? gabapentin (NEURONTIN) 300 MG capsule Take 1 capsule (300 mg total) by mouth 3 (three) times daily. 90 capsule 2  ? hydrOXYzine (ATARAX) 50 MG tablet Take 2 tablets (100 mg total) by mouth 3 (three) times daily as needed for anxiety. 180 tablet 2  ? traZODone (DESYREL) 50 MG tablet Take 1 tablet (50 mg total) by mouth at bedtime as needed for sleep. 30 tablet 2  ? ?No current facility-administered medications for this visit.  ? ? ? ?Musculoskeletal: ?Strength & Muscle Tone: N/A virtual visit ?Gait & Station: N/A ?Patient leans: N/A ? ?Psychiatric Specialty Exam: ?Review of Systems  ?Psychiatric/Behavioral:  Negative for hallucinations, self-injury and suicidal ideas. The patient is nervous/anxious.   ?All other systems reviewed and are negative.  ?There were no vitals taken for this visit.There is no height or weight on file to calculate BMI.  ?General Appearance: Fairly Groomed  ?Eye Contact:  Good  ?Speech:  Clear and Coherent  ?Volume:  Normal  ?Mood:  Anxious  ?Affect:  Appropriate  ?Thought Process:  Goal Directed  ?Orientation:  Full (Time, Place, and Person)  ?Thought Content: Logical   ?Suicidal Thoughts:  No  ?Homicidal Thoughts:  No  ?Memory: Good  ?Judgement: Good  ?Insight: Good  ?Psychomotor Activity:  NA  ?Concentration: Good  ?Recall: Good  ?Fund of Knowledge: Good  ?Language: Good  ?Akathisia: N/A  ?Handed: Right  ?AIMS (if indicated): Not done  ?Assets:  Communication Skills ?Desire for Improvement  ?ADL's:  Intact  ?Cognition: WNL  ?Sleep:  Good  ? ?Screenings: ?GAD-7   ? ?[place] Office Visit from 04/02/2021 in [facility] Office Visit from 08/29/2020 in [facility]  ?Total GAD-7 Score 17 2  ? ?   ? ?PHQ2-9   ? ?[place] Office Visit from 04/02/2021 in [facility] Office Visit from 08/29/2020 in [facility]  ?PHQ-2 Total Score 3 0  ?PHQ-9 Total Score 5 --  ? ?  ? ?Flowsheet Row ED from 05/28/2021 in [facility] DEPT ED from 01/10/2021 in [place] ED from 12/28/2020 in [facility]  ?C-SSRS RISK CATEGORY No Risk No Risk High Risk  ? ?  ? ? ? ?Assessment and Plan: Zachary Freeman is a 36 year old male presenting to [facility] behavioral health outpatient for follow-up psychiatric evaluation.  He has a psychiatric history of major depressive disorder, generalized anxiety disorder and polysubstance use and his symptoms are managed with gabapentin 300 mg 3 times daily, hydroxyzine 50 mg 3 times daily as needed for anxiety and trazodone 50 mg at bedtime as needed for sleep.  Patient reports medication compliance but continued symptoms  of anxiety.  Patient request to increase hydroxyzine, stating that sometimes he takes 2 tablets and 100 mg is effective with managing his anxiety symptoms.  Medication benefits versus risk discussed.  Hydroxyzine increased to 100 mg 3 times daily as needed for anxiety.  Patient denies adverse medication effects.  Remaining medications refilled at current dosages. ? ?Collaboration of Care: Collaboration of Care: Medication Management AEB medications E scribed to patient's preferred pharmacy. ? ?1. Generalized anxiety disorder ? ?- hydrOXYzine (ATARAX) 50 MG tablet; Take 2 tablets (100 mg total) by mouth 3 (three) times daily as needed for anxiety.  Dispense: 180 tablet; Refill: 2 ?- gabapentin (NEURONTIN) 300 MG capsule; Take 1 capsule (300 mg total) by mouth 3 (three) times daily.  Dispense: 90 capsule; Refill: 2 ? ?2. Opioid use disorder, severe, in early remission ([place]) ? ?- gabapentin (NEURONTIN) 300 MG capsule; Take 1  capsule (300 mg total) by mouth 3 (three) times daily.  Dispense: 90 capsule; Refill: 2 ? ?3. Stimulant use disorder, in early remission ? ?- gabapentin (NEURONTIN) 300 MG capsule; Take 1 capsule (300 mg total) by m

## 2021-06-19 ENCOUNTER — Encounter (HOSPITAL_BASED_OUTPATIENT_CLINIC_OR_DEPARTMENT_OTHER): Payer: Self-pay

## 2021-06-19 ENCOUNTER — Ambulatory Visit (HOSPITAL_BASED_OUTPATIENT_CLINIC_OR_DEPARTMENT_OTHER): Payer: 59 | Admitting: Nurse Practitioner

## 2021-06-21 ENCOUNTER — Telehealth (HOSPITAL_BASED_OUTPATIENT_CLINIC_OR_DEPARTMENT_OTHER): Payer: Self-pay | Admitting: Nurse Practitioner

## 2021-06-21 NOTE — Telephone Encounter (Signed)
Left voicemail for pt responding to his appointment request that was submitted on 04/18. Requested for pt to call back to schedule. ?

## 2021-06-26 ENCOUNTER — Ambulatory Visit (HOSPITAL_BASED_OUTPATIENT_CLINIC_OR_DEPARTMENT_OTHER): Payer: 59 | Admitting: Nurse Practitioner

## 2021-07-11 ENCOUNTER — Encounter: Payer: Self-pay | Admitting: Nurse Practitioner

## 2021-07-12 ENCOUNTER — Ambulatory Visit (INDEPENDENT_AMBULATORY_CARE_PROVIDER_SITE_OTHER): Payer: 59 | Admitting: Nurse Practitioner

## 2021-07-12 ENCOUNTER — Encounter: Payer: Self-pay | Admitting: Nurse Practitioner

## 2021-07-12 VITALS — BP 105/71 | HR 80 | Temp 97.9°F | Ht 70.08 in | Wt 223.4 lb

## 2021-07-12 DIAGNOSIS — I517 Cardiomegaly: Secondary | ICD-10-CM

## 2021-07-12 DIAGNOSIS — R079 Chest pain, unspecified: Secondary | ICD-10-CM | POA: Diagnosis not present

## 2021-07-12 DIAGNOSIS — R5383 Other fatigue: Secondary | ICD-10-CM | POA: Diagnosis not present

## 2021-07-12 DIAGNOSIS — Z6831 Body mass index (BMI) 31.0-31.9, adult: Secondary | ICD-10-CM

## 2021-07-12 DIAGNOSIS — F17209 Nicotine dependence, unspecified, with unspecified nicotine-induced disorders: Secondary | ICD-10-CM

## 2021-07-12 DIAGNOSIS — Z0000 Encounter for general adult medical examination without abnormal findings: Secondary | ICD-10-CM

## 2021-07-12 NOTE — Progress Notes (Signed)
? ?New Patient Office Visit ? ?Subjective   ? ?Patient ID: Zachary Freeman, male    DOB: [birth year]  Age: 36 y.o. MRN: [id number] ? ?CC:  ?Chief Complaint  ?Patient presents with  ? New Patient (Initial Visit)  ? ? ?HPI ?Zachary Freeman presents to establish care ?The patient has not had primary care for some time.  ?-episodes of chest pain since March. Did go to the ER once. Did have ecg which showed sinus rhythm. Chest x-ray showed cardiomegaly. It was otherwise normal. No blood work was done. Patient left before being seen. ?-chest pain is intermittent. Currently he feels good. Denies shortness of breath.  ?-due to have routine, fasting labs.  ?-due to have routine physical.  ? ?Outpatient Encounter Medications as of 07/12/2021  ?Medication Sig  ? hydrOXYzine (ATARAX) 50 MG tablet Take 2 tablets (100 mg total) by mouth 3 (three) times daily as needed for anxiety.  ? [DISCONTINUED] gabapentin (NEURONTIN) 300 MG capsule Take 1 capsule (300 mg total) by mouth 3 (three) times daily.  ? [DISCONTINUED] traZODone (DESYREL) 50 MG tablet Take 1 tablet (50 mg total) by mouth at bedtime as needed for sleep.  ? ?No facility-administered encounter medications on file as of 07/12/2021.  ? ? ?History reviewed. No pertinent past medical history. ? ?History reviewed. No pertinent surgical history. ? ?History reviewed. No pertinent family history. ? ?Social History  ? ?Socioeconomic History  ? Marital status: Single  ?  Spouse name: Not on file  ? Number of children: Not on file  ? Years of education: Not on file  ? Highest education level: Not on file  ?Occupational History  ? Not on file  ?Tobacco Use  ? Smoking status: Some Days  ?  Packs/day: 0.50  ?  Years: 10.00  ?  Pack years: 5.00  ?  Types: Cigarettes  ? Smokeless tobacco: Never  ?Vaping Use  ? Vaping Use: Not on file  ?Substance and Sexual Activity  ? Alcohol use: Not Currently  ? Drug use: Not Currently  ? Sexual activity: Yes  ?Other Topics Concern  ? Not on file  ?Social  History Narrative  ? Not on file  ? ?Social Determinants of Health  ? ?Financial Resource Strain: Not on file  ?Food Insecurity: Not on file  ?Transportation Needs: Not on file  ?Physical Activity: Not on file  ?Stress: Not on file  ?Social Connections: Not on file  ?Intimate Partner Violence: Not on file  ? ? ?Review of Systems  ?Constitutional:  Negative for chills, fever and malaise/fatigue.  ?HENT:  Negative for congestion, sinus pain and sore throat.   ?Eyes: Negative.   ?Respiratory:  Negative for cough, shortness of breath and wheezing.   ?Cardiovascular:  Positive for chest pain, palpitations and orthopnea. Negative for leg swelling.  ?Gastrointestinal:  Negative for constipation, diarrhea, nausea and vomiting.  ?Genitourinary: Negative.   ?Musculoskeletal:  Negative for myalgias.  ?Skin: Negative.   ?Neurological:  Negative for dizziness and headaches.  ?Endo/Heme/Allergies:  Does not bruise/bleed easily.  ?Psychiatric/Behavioral:  Negative for depression. The patient is not nervous/anxious.   ? ?  ? ? ?Objective   ? ?Today's Vitals  ? 07/12/21 1127  ?BP: 105/71  ?Pulse: 80  ?Temp: 97.9 ?F (36.6 ?C)  ?SpO2: 97%  ?Weight: 223 lb 6.4 oz (101.3 kg)  ?Height: 5' 10.08" (1.78 m)  ? ?Body mass index is 31.98 kg/m?.  ?. ? ?Physical Exam ?Vitals and nursing note reviewed.  ?Constitutional:   ?  Appearance: Normal appearance. He is well-developed.  ?HENT:  ?   Head: Normocephalic and atraumatic.  ?   Nose: Nose normal.  ?   Mouth/Throat:  ?   Mouth: Mucous membranes are moist.  ?   Pharynx: Oropharynx is clear.  ?Eyes:  ?   Extraocular Movements: Extraocular movements intact.  ?   Conjunctiva/sclera: Conjunctivae normal.  ?   Pupils: Pupils are equal, round, and reactive to light.  ?Cardiovascular:  ?   Rate and Rhythm: Normal rate and regular rhythm.  ?   Pulses: Normal pulses.  ?   Heart sounds: Normal heart sounds.  ?Pulmonary:  ?   Effort: Pulmonary effort is normal.  ?   Breath sounds: Normal breath sounds.   ?Abdominal:  ?   Palpations: Abdomen is soft.  ?Musculoskeletal:     ?   General: Normal range of motion.  ?   Cervical back: Normal range of motion and neck supple.  ?Lymphadenopathy:  ?   Cervical: No cervical adenopathy.  ?Skin: ?   General: Skin is warm and dry.  ?   Capillary Refill: Capillary refill takes less than 2 seconds.  ?Neurological:  ?   General: No focal deficit present.  ?   Mental Status: He is alert and oriented to person, place, and time.  ?Psychiatric:     ?   Mood and Affect: Mood normal.     ?   Behavior: Behavior normal.     ?   Thought Content: Thought content normal.     ?   Judgment: Judgment normal.  ? ?  ? ?Assessment & Plan:  ?1. Chest pain, unspecified type ?Reviewed ECG and chest x-ray done during visit to ER. ECG showing normal sinus rhythm and chest x-ray showing cardiomegaly. Will draw labs, including lipids and CKMB. Refer to cardiology for further evaluation and treatment.  ?- TSH; Future ?- Comp Met (CMET); Future ?- Hemoglobin A1c; Future ?- Lipid panel; Future ?- CBC with Differential/Platelet; Future ?- CBC with Differential/Platelet ?- Lipid panel ?- Hemoglobin A1c ?- Comp Met (CMET) ?- TSH ?- Ambulatory referral to Cardiology ? ?2. Cardiomegaly ?Cardiomegaly present on chest x-ray. Unclear etiology. Cannot rule out cardiomyopathy. Refer to cardiology for further evaluation.  ?- TSH; Future ?- Comp Met (CMET); Future ?- Hemoglobin A1c; Future ?- Lipid panel; Future ?- CBC with Differential/Platelet; Future ?- CK (Creatine Kinase); Future ?- CK (Creatine Kinase) ?- CBC with Differential/Platelet ?- Lipid panel ?- Hemoglobin A1c ?- Comp Met (CMET) ?- TSH ?- Ambulatory referral to Cardiology ? ?3. Other fatigue ?Check labs with thyroid panel.  ?- T4, free; Future ?- T4, free ? ?4. Body mass index (BMI) of 31.0-31.9 in adult ?Encourage patient to limit calorie intake to 2000 cal/day or less.  He should consume a low cholesterol, low-fat diet.   ? ?5. Tobacco use disorder,  continuous ?Patient is a current and everyday smoker. He is not ready to quit smoking at this time.  ? ?6. Healthcare maintenance ?Routine, fasting labs drawn during today's visit.  ?- TSH; Future ?- Comp Met (CMET); Future ?- Hemoglobin A1c; Future ?- Lipid panel; Future ?- CBC with Differential/Platelet; Future ?- CBC with Differential/Platelet ?- Lipid panel ?- Hemoglobin A1c ?- Comp Met (CMET) ?- TSH  ? ?Problem List Items Addressed This Visit   ? ?  ? Cardiovascular and Mediastinum  ? Cardiomegaly  ? Relevant Orders  ? TSH  ? Comp Met (CMET)  ? Hemoglobin A1c  ? Lipid panel  ?  CBC with Differential/Platelet  ? CK (Creatine Kinase)  ? Ambulatory referral to Cardiology  ?  ? Other  ? Chest pain - Primary  ? Relevant Orders  ? TSH  ? Comp Met (CMET)  ? Hemoglobin A1c  ? Lipid panel  ? CBC with Differential/Platelet  ? Ambulatory referral to Cardiology  ? Other fatigue  ? Relevant Orders  ? T4, free  ? Body mass index (BMI) of 31.0-31.9 in adult  ? Tobacco use disorder, continuous  ? ?Other Visit Diagnoses   ? ? Healthcare maintenance      ? Relevant Orders  ? TSH  ? Comp Met (CMET)  ? Hemoglobin A1c  ? Lipid panel  ? CBC with Differential/Platelet  ? ?  ? ? ?Return in about 6 weeks (around 08/23/2021) for health maintenance exam.  ? ?Ronnell Freshwater, NP ? ? ?

## 2021-07-13 LAB — COMPREHENSIVE METABOLIC PANEL
ALT: 66 IU/L — ABNORMAL HIGH (ref 0–44)
AST: 36 IU/L (ref 0–40)
Albumin/Globulin Ratio: 1.6 (ref 1.2–2.2)
Albumin: 4.7 g/dL (ref 4.0–5.0)
Alkaline Phosphatase: 60 IU/L (ref 44–121)
BUN/Creatinine Ratio: 18 (ref 9–20)
BUN: 17 mg/dL (ref 6–20)
Bilirubin Total: 1.1 mg/dL (ref 0.0–1.2)
CO2: 24 mmol/L (ref 20–29)
Calcium: 9.7 mg/dL (ref 8.7–10.2)
Chloride: 102 mmol/L (ref 96–106)
Creatinine, Ser: 0.97 mg/dL (ref 0.76–1.27)
Globulin, Total: 2.9 g/dL (ref 1.5–4.5)
Glucose: 82 mg/dL (ref 70–99)
Potassium: 4.4 mmol/L (ref 3.5–5.2)
Sodium: 140 mmol/L (ref 134–144)
Total Protein: 7.6 g/dL (ref 6.0–8.5)
eGFR: 106 mL/min/{1.73_m2} (ref >59)

## 2021-07-13 LAB — CBC WITH DIFFERENTIAL/PLATELET
Basophils Absolute: 0 10*3/uL (ref 0.0–0.2)
Basos: 0 %
EOS (ABSOLUTE): 0.2 10*3/uL (ref 0.0–0.4)
Eos: 2 %
Hematocrit: 43.2 % (ref 37.5–51.0)
Hemoglobin: 15 g/dL (ref 13.0–17.7)
Immature Grans (Abs): 0 10*3/uL (ref 0.0–0.1)
Immature Granulocytes: 0 %
Lymphocytes Absolute: 2.6 10*3/uL (ref 0.7–3.1)
Lymphs: 32 %
MCH: 28.2 pg (ref 26.6–33.0)
MCHC: 34.7 g/dL (ref 31.5–35.7)
MCV: 81 fL (ref 79–97)
Monocytes Absolute: 0.7 10*3/uL (ref 0.1–0.9)
Monocytes: 9 %
Neutrophils Absolute: 4.5 10*3/uL (ref 1.4–7.0)
Neutrophils: 57 %
Platelets: 261 10*3/uL (ref 150–450)
RBC: 5.32 x10E6/uL (ref 4.14–5.80)
RDW: 12.9 % (ref 11.6–15.4)
WBC: 8 10*3/uL (ref 3.4–10.8)

## 2021-07-13 LAB — LIPID PANEL
Chol/HDL Ratio: 5.1 ratio — ABNORMAL HIGH (ref 0.0–5.0)
Cholesterol, Total: 213 mg/dL — ABNORMAL HIGH (ref 100–199)
HDL: 42 mg/dL (ref >39)
LDL Chol Calc (NIH): 142 mg/dL — ABNORMAL HIGH (ref 0–99)
Triglycerides: 160 mg/dL — ABNORMAL HIGH (ref 0–149)
VLDL Cholesterol Cal: 29 mg/dL (ref 5–40)

## 2021-07-13 LAB — HEMOGLOBIN A1C
Est. average glucose Bld gHb Est-mCnc: 108 mg/dL
Hgb A1c MFr Bld: 5.4 % (ref 4.8–5.6)

## 2021-07-13 LAB — TSH: TSH: 0.776 u[IU]/mL (ref 0.450–4.500)

## 2021-07-13 LAB — T4, FREE: Free T4: 1.19 ng/dL (ref 0.82–1.77)

## 2021-07-13 LAB — CK: Total CK: 98 U/L (ref 49–439)

## 2021-07-15 NOTE — Progress Notes (Signed)
Please let the patient know that labs are back. There is mild elevation of lipids. I recommend he limit intake of fried and fatty foods. He should increase intake of lean proteins and green leafy vegetables. Adding exercise into daily routine will also be beneficial.  All other labs were normal.  ?Thanks so much.   -HB

## 2021-07-16 ENCOUNTER — Encounter: Payer: Self-pay | Admitting: Nurse Practitioner

## 2021-07-22 ENCOUNTER — Encounter: Payer: Self-pay | Admitting: Family Medicine

## 2021-07-23 ENCOUNTER — Telehealth: Payer: 59 | Admitting: Nurse Practitioner

## 2021-07-23 DIAGNOSIS — K047 Periapical abscess without sinus: Secondary | ICD-10-CM

## 2021-07-23 MED ORDER — AMOXICILLIN 500 MG PO CAPS: 500.0000 mg | ORAL_CAPSULE | Freq: Three times a day (TID) | ORAL | 0 refills | Status: AC

## 2021-07-23 NOTE — Progress Notes (Signed)
Virtual Visit Consent   Zachary Freeman, you are scheduled for a virtual visit with a [facility] provider today. Just as with appointments in the office, your consent must be obtained to participate. Your consent will be active for this visit and any virtual visit you may have with one of our providers in the next 365 days. If you have a MyChart account, a copy of this consent can be sent to you electronically.  As this is a virtual visit, video technology does not allow for your provider to perform a traditional examination. This may limit your provider's ability to fully assess your condition. If your provider identifies any concerns that need to be evaluated in person or the need to arrange testing (such as labs, EKG, etc.), we will make arrangements to do so. Although advances in technology are sophisticated, we cannot ensure that it will always work on either your end or our end. If the connection with a video visit is poor, the visit may have to be switched to a telephone visit. With either a video or telephone visit, we are not always able to ensure that we have a secure connection.  By engaging in this virtual visit, you consent to the provision of healthcare and authorize for your insurance to be billed (if applicable) for the services provided during this visit. Depending on your insurance coverage, you may receive a charge related to this service.  I need to obtain your verbal consent now. Are you willing to proceed with your visit today? Zachary Freeman has provided verbal consent on 07/23/2021 for a virtual visit (video or telephone). Viviano Simas, FNP  Date: 07/23/2021 5:15 PM  Virtual Visit via Video Note   I, Viviano Simas, connected with  Zachary Freeman  ([id number], November 09, 1985) on 07/23/21 at  5:15 PM EDT by a video-enabled telemedicine application and verified that I am speaking with the correct person using two identifiers.  Location: Patient: Virtual Visit Location Patient:  Home Provider: Virtual Visit Location Provider: Home Office   I discussed the limitations of evaluation and management by telemedicine and the availability of in person appointments. The patient expressed understanding and agreed to proceed.    History of Present Illness: Zachary Freeman is a 36 y.o. who identifies as a male who was assigned male at birth, and is being seen today for recurrent toothache He was on a course of amoxicillin last month and was unable to get a dental appointment until next week. He is now having recurrent pain on top left side. The symptoms did improve on the antibiotics.   Denies fever or other systemic symptoms today.    Problems:  Patient Active Problem List   Diagnosis Date Noted   Chest pain 07/12/2021   Cardiomegaly 07/12/2021   Other fatigue 07/12/2021   Body mass index (BMI) of 31.0-31.9 in adult 07/12/2021   Tobacco use disorder, continuous 07/12/2021   Substance induced mood disorder (HCC) 10/22/2020   MDD (major depressive disorder), recurrent, in full remission (HCC) 08/29/2020   Opioid use disorder, severe, in early remission (HCC) 08/29/2020   Stimulant use disorder, in early remission 08/29/2020   Hepatitis C test positive 08/29/2020    Allergies: No Known Allergies Medications:  Current Outpatient Medications:    hydrOXYzine (ATARAX) 50 MG tablet, Take 2 tablets (100 mg total) by mouth 3 (three) times daily as needed for anxiety., Disp: 180 tablet, Rfl: 2  Observations/Objective: Patient is well-developed, well-nourished in no acute distress.  Resting  comfortably  at home.  Head is normocephalic, atraumatic.  No labored breathing.  Speech is clear and coherent with logical content.  Patient is alert and oriented at baseline.    Assessment and Plan: 1. Tooth infection  - amoxicillin (AMOXIL) 500 MG capsule; Take 1 capsule (500 mg total) by mouth 3 (three) times daily for 10 days.  Dispense: 30 capsule; Refill: 0    May alternate  tylenol and ibuprofen for pain relief.   Follow Up Instructions: I discussed the assessment and treatment plan with the patient. The patient was provided an opportunity to ask questions and all were answered. The patient agreed with the plan and demonstrated an understanding of the instructions.  A copy of instructions were sent to the patient via MyChart unless otherwise noted below.    The patient was advised to call back or seek an in-person evaluation if the symptoms worsen or if the condition fails to improve as anticipated.  Time:  I spent 10 minutes with the patient via telehealth technology discussing the above problems/concerns.    Viviano Simas, FNP

## 2021-07-24 ENCOUNTER — Encounter (HOSPITAL_BASED_OUTPATIENT_CLINIC_OR_DEPARTMENT_OTHER): Payer: Self-pay | Admitting: Nurse Practitioner

## 2021-08-01 ENCOUNTER — Emergency Department (HOSPITAL_COMMUNITY)
Admission: EM | Admit: 2021-08-01 | Discharge: 2021-08-01 | Disposition: A | Discharge status: Home / Self Care | Payer: 59 | Attending: Emergency Medicine | Admitting: Emergency Medicine

## 2021-08-01 ENCOUNTER — Emergency Department (HOSPITAL_COMMUNITY): Payer: 59

## 2021-08-01 ENCOUNTER — Other Ambulatory Visit: Payer: Self-pay

## 2021-08-01 DIAGNOSIS — R079 Chest pain, unspecified: Secondary | ICD-10-CM

## 2021-08-01 DIAGNOSIS — R0789 Other chest pain: Secondary | ICD-10-CM | POA: Diagnosis not present

## 2021-08-01 LAB — BASIC METABOLIC PANEL
Anion gap: 8 (ref 5–15)
BUN: 18 mg/dL (ref 6–20)
CO2: 24 mmol/L (ref 22–32)
Calcium: 8.9 mg/dL (ref 8.9–10.3)
Chloride: 103 mmol/L (ref 98–111)
Creatinine, Ser: 1.04 mg/dL (ref 0.61–1.24)
GFR, Estimated: >60 mL/min (ref >60)
Glucose, Bld: 110 mg/dL — ABNORMAL HIGH (ref 70–99)
Potassium: 3.9 mmol/L (ref 3.5–5.1)
Sodium: 135 mmol/L (ref 135–145)

## 2021-08-01 LAB — CBC
HCT: 42.8 % (ref 39.0–52.0)
Hemoglobin: 14.2 g/dL (ref 13.0–17.0)
MCH: 27.4 pg (ref 26.0–34.0)
MCHC: 33.2 g/dL (ref 30.0–36.0)
MCV: 82.5 fL (ref 80.0–100.0)
Platelets: 262 10*3/uL (ref 150–400)
RBC: 5.19 MIL/uL (ref 4.22–5.81)
RDW: 12.8 % (ref 11.5–15.5)
WBC: 8.3 10*3/uL (ref 4.0–10.5)
nRBC: 0 % (ref 0.0–0.2)

## 2021-08-01 LAB — TROPONIN I (HIGH SENSITIVITY)
Troponin I (High Sensitivity): <2 ng/L (ref <18)
Troponin I (High Sensitivity): <2 ng/L (ref <18)

## 2021-08-01 NOTE — Discharge Instructions (Signed)
Your testing so far has not been abnormal at all, your heart test specifically has not resulted and you are leaving before it is back.  You are adopting all of the risk of leaving without all of the results but you may return at any time should you change your mind.

## 2021-08-01 NOTE — ED Provider Notes (Signed)
[facility] EMERGENCY DEPARTMENT Provider Note   CSN: [id number] Arrival date & time: 08/01/21  0719     History  Chief Complaint  Patient presents with   Chest Pain   Shortness of Breath    Zachary Freeman is a 36 y.o. male.   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  The patient works in crawl spaces, he is constantly on his chest and crawling on his elbows and reports that over the last month or so he has had intermittent random episodes of chest discomfort, he has been scheduled to see a specialist tomorrow morning because he was told that he had an enlarged heart on an x-ray that was performed recently.  This x-ray was performed May 28, 2021 when the patient presented to the emergency department with left-sided chest pain, the patient did not stay for the residual part of his emergency department visit outside of the medical screening exam  He did not have any further evaluation as he left without completing his care.  He reports today that over the last month he has had intermittent chest pain, this morning he had some chest pain that was located on the left side of his chest, heaviness, it is now completely gone away.  When this comes on it last about 5 minutes, it does not radiate to his arm or his back, it is not associated with shortness of breath or nausea or diaphoresis, it is not necessarily exertional as it will come on sometimes at rest, sometimes in the morning in fact he states most often is in the morning.  No swelling of the legs, no coughing, no hemoptysis  Home Medications Prior to Admission medications   Medication Sig Start Date End Date Taking? Authorizing Provider  amoxicillin (AMOXIL)  500 MG capsule Take 1 capsule (500 mg total) by mouth 3 (three) times daily for 10 days. 07/23/21 08/02/21 Yes Viviano Simas, FNP  hydrOXYzine (ATARAX) 50 MG tablet Take 2 tablets (100 mg total) by mouth 3 (three) times daily as needed for anxiety. 06/18/21  Yes Penn, Cicely, NP  Multiple Vitamin (MULTIVITAMIN) tablet Take 1 tablet by mouth daily.   Yes [provider]      Allergies    Patient has no known allergies.    Review of Systems   Review of Systems  Respiratory:  Negative for shortness of breath.   Cardiovascular:  Positive for chest pain.  All other systems reviewed and are negative.  Physical Exam Updated Vital Signs BP 110/86   Pulse 84   Temp 98.3 F (36.8 C)   Resp 19   Ht 1.727 m (5\' 8" )   Wt 102.1 kg   SpO2 99%   BMI 34.21 kg/m  Physical Exam Vitals and nursing note reviewed.  Constitutional:      General: He is not in acute distress.    Appearance: He is well-developed.  HENT:     Head: Normocephalic and atraumatic.     Mouth/Throat:     Pharynx: No oropharyngeal exudate.  Eyes:     General: No scleral icterus.       Right eye: No discharge.        Left eye: No discharge.  Conjunctiva/sclera: Conjunctivae normal.     Pupils: Pupils are equal, round, and reactive to light.  Neck:     Thyroid: No thyromegaly.     Vascular: No JVD.  Cardiovascular:     Rate and Rhythm: Normal rate and regular rhythm.     Heart sounds: Normal heart sounds. No murmur heard.   No friction rub. No gallop.  Pulmonary:     Effort: Pulmonary effort is normal. No respiratory distress.     Breath sounds: Normal breath sounds. No wheezing or rales.  Abdominal:     General: Bowel sounds are normal. There is no distension.     Palpations: Abdomen is soft. There is no mass.     Tenderness: There is no abdominal tenderness.  Musculoskeletal:        General: No tenderness. Normal range of motion.     Cervical back: Normal range of motion and neck supple.   Lymphadenopathy:     Cervical: No cervical adenopathy.  Skin:    General: Skin is warm and dry.     Findings: No erythema or rash.  Neurological:     Mental Status: He is alert.     Coordination: Coordination normal.  Psychiatric:        Behavior: Behavior normal.    ED Results / Procedures / Treatments   Labs (all labs ordered are listed, but only abnormal results are displayed) Labs Reviewed  BASIC METABOLIC PANEL - Abnormal; Notable for the following components:      Result Value   Glucose, Bld 110 (*)    All other components within normal limits  CBC  TROPONIN I (HIGH SENSITIVITY)  TROPONIN I (HIGH SENSITIVITY)    EKG None  Radiology DG Chest 2 View  Result Date: 08/01/2021 CLINICAL DATA:  Centered on left-sided chest pain and pressure EXAM: CHEST - 2 VIEW COMPARISON:  Chest radiograph 05/28/2021 FINDINGS: The cardiomediastinal silhouette is normal. There is no focal consolidation or pulmonary edema. There is no pleural effusion or pneumothorax There is no acute osseous abnormality. IMPRESSION: No radiographic evidence of acute cardiopulmonary process. Electronically Signed   By: Lesia HausenPeter  Noone M.D.   On: 08/01/2021 08:08    Procedures Procedures    Medications Ordered in ED Medications - No data to display  ED Course/ Medical Decision Making/ A&P                           Medical Decision Making Amount and/or Complexity of Data Reviewed Labs: ordered. Radiology: ordered.   This patient's exam is unremarkable, he does not have any acute findings to suggest a cause of his symptoms, clinically the patient has minimal hypertension and he is not tachycardic.  He has no edema of the legs, he is not hypoxic and not short of breath and this does not seem consistent with pulmonary embolism or aortic dissection.  Acute coronary syndrome also seems less likely given the lack of exertional component of the symptoms which been present for over a month.  Additionally he is  asymptomatic but thankfully has follow-up tomorrow with a cardiologist.  This is according to the patient's report.  We will obtain a single troponin and an EKG, the patient is agreeable  Labs: CBC and metabolic panel unremarkable, the patient is requesting to leave before the troponin is back stating that he has a doctor's appointment this morning that he does not want to miss.  I have informed the patient that he  needs to return should symptoms worsen that he can return at any time and that he is leaving before the entirety of his work-up is completed.  He is agreeable  Chest x-ray: I personally viewed and interpreted the x-ray which shows no signs of cardiomegaly or abnormal findings of the lungs or the heart.  ED course: The patient is remained what appears to be stable with normal blood pressure heart rate and oxygen levels.  He is aware that he should return should symptoms worsen        Final Clinical Impression(s) / ED Diagnoses Final diagnoses:  Chest pain, unspecified type      Eber Hong, MD 08/01/21 0900

## 2021-08-01 NOTE — ED Triage Notes (Signed)
Pt. Stated, ive been having chest pain with sometimes SOB for the last month and half. I go see a cardiologist tomorrow. This morning my chest felt like it was heavy.

## 2021-08-02 ENCOUNTER — Ambulatory Visit: Payer: 59 | Admitting: Cardiology

## 2021-08-02 NOTE — Progress Notes (Deleted)
Cardiology CONSULT Note    Date:  08/02/2021   ID:  Zachary Freeman, DOB 1985-08-11, MRN [id number]  PCP:  Zachary Jews, NP  Cardiologist:  Zachary Magic, MD   No chief complaint on file.   History of Present Illness:  Zachary Freeman is a 36 y.o. male who is being seen today for the evaluation of chest pain at the request of Zachary Freeman, Zachary Grate, NP.  Zachary Freeman is a 36yo male with no prior medical hx who is referred for evaluation of chest pain.    No past medical history on file.  No past surgical history on file.  Current Medications: No outpatient medications have been marked as taking for the 08/02/21 encounter (Appointment) with Zachary Reichert, MD.    Allergies:   Patient has no known allergies.   Social History   Socioeconomic History   Marital status: Single    Spouse name: Not on file   Number of children: Not on file   Years of education: Not on file   Highest education level: Not on file  Occupational History   Not on file  Tobacco Use   Smoking status: Some Days    Packs/day: 0.50    Years: 10.00    Pack years: 5.00    Types: Cigarettes   Smokeless tobacco: Never  Vaping Use   Vaping Use: Not on file  Substance and Sexual Activity   Alcohol use: Not Currently   Drug use: Not Currently   Sexual activity: Yes  Other Topics Concern   Not on file  Social History Narrative   Not on file   Social Determinants of Health   Financial Resource Strain: Not on file  Food Insecurity: Not on file  Transportation Needs: Not on file  Physical Activity: Not on file  Stress: Not on file  Social Connections: Not on file     Family History:  The patient's family history is not on file.   ROS:   Please see the history of present illness.    ROS All other systems reviewed and are negative.      View : No data to display.             PHYSICAL EXAM:   VS:  There were no vitals taken for this visit.   GEN: Well nourished, well developed, in no acute  distress  HEENT: normal  Neck: no JVD, carotid bruits, or masses Cardiac: RRR; no murmurs, rubs, or gallops,no edema.  Intact distal pulses bilaterally.  Respiratory:  clear to auscultation bilaterally, normal work of breathing GI: soft, nontender, nondistended, + BS MS: no deformity or atrophy  Skin: warm and dry, no rash Neuro:  Alert and Oriented x 3, Strength and sensation are intact Psych: euthymic mood, full affect  Wt Readings from Last 3 Encounters:  08/01/21 225 lb (102.1 kg)  07/12/21 223 lb 6.4 oz (101.3 kg)  05/28/21 220 lb (99.8 kg)      Studies/Labs Reviewed:   EKG:  EKG is ordered today.  The ekg ordered today demonstrates ***  Recent Labs: 07/12/2021: ALT 66; TSH 0.776 08/01/2021: BUN 18; Creatinine, Ser 1.04; Hemoglobin 14.2; Platelets 262; Potassium 3.9; Sodium 135   Lipid Panel    Component Value Date/Time   CHOL 213 (H) 07/12/2021 1148   TRIG 160 (H) 07/12/2021 1148   HDL 42 07/12/2021 1148   CHOLHDL 5.1 (H) 07/12/2021 1148   CHOLHDL 3.8 10/22/2020 0254   VLDL 11 10/22/2020 0254  LDLCALC 142 (H) 07/12/2021 1148    Additional studies/ records that were reviewed today include:  OV notes from PCP    ASSESSMENT:    1. Chest pain of uncertain etiology      PLAN:  In order of problems listed above:  Chest pain  Time Spent: *** minutes total time of encounter, including *** minutes spent in face-to-face patient care on the date of this encounter. This time includes coordination of care and counseling regarding above mentioned problem list. Remainder of non-face-to-face time involved reviewing chart documents/testing relevant to the patient encounter and documentation in the medical record. I have independently reviewed documentation from referring provider  Medication Adjustments/Labs and Tests Ordered: Current medicines are reviewed at length with the patient today.  Concerns regarding medicines are outlined above.  Medication changes, Labs and  Tests ordered today are listed in the Patient Instructions below.  There are no Patient Instructions on file for this visit.   Signed, Zachary Magic, MD  08/02/2021 12:54 PM    [facility] [address] [place], [place], [place]  [address] Phone: [phone]; Fax: [phone]

## 2021-08-23 ENCOUNTER — Ambulatory Visit: Payer: 59 | Admitting: Nurse Practitioner

## 2021-08-23 NOTE — Progress Notes (Deleted)
Complete physical exam   Patient: Zachary Freeman   DOB: 08-01-1985   36 y.o. Male  MRN: [id number] Visit Date: 08/23/2021    No chief complaint on file.  Subjective    Zachary Freeman is a 36 y.o. male who presents today for a complete physical exam.  He reports consuming a {diet types:17450} diet. {Exercise:19826} He generally feels {well/fairly well/poorly:18703}. He {does/does not:200015} have additional problems to discuss today.   HPI  Initial visit, had been c/o intermittent chest pain -was referred to cardiology. Appears they were unable to contact him to set up appointment.  -has been seen again in ER 08/01/2021 due to chest pain.  -most recent ECG done 08/02/2021 and was normal   Had routine fasting labs done at initial visit.  -mild to moderate elevation of lipid panel  -other labs were within normal limits.   No past medical history on file.  No past surgical history on file.  Social History   Socioeconomic History   Marital status: Single    Spouse name: Not on file   Number of children: Not on file   Years of education: Not on file   Highest education level: Not on file  Occupational History   Not on file  Tobacco Use   Smoking status: Some Days    Packs/day: 0.50    Years: 10.00    Total pack years: 5.00    Types: Cigarettes   Smokeless tobacco: Never  Vaping Use   Vaping Use: Not on file  Substance and Sexual Activity   Alcohol use: Not Currently   Drug use: Not Currently   Sexual activity: Yes  Other Topics Concern   Not on file  Social History Narrative   Not on file   Social Determinants of Health   Financial Resource Strain: Not on file  Food Insecurity: Not on file  Transportation Needs: Not on file  Physical Activity: Not on file  Stress: Not on file  Social Connections: Not on file  Intimate Partner Violence: Not on file   No family status information on file.   No family history on file. No Known Allergies  Patient Care  Team: Carlean Jews, NP as PCP - General (Family Medicine) Quintella Reichert, MD as PCP - Cardiology (Cardiology)   Medications: Outpatient Medications Prior to Visit  Medication Sig   hydrOXYzine (ATARAX) 50 MG tablet Take 2 tablets (100 mg total) by mouth 3 (three) times daily as needed for anxiety.   Multiple Vitamin (MULTIVITAMIN) tablet Take 1 tablet by mouth daily.   No facility-administered medications prior to visit.    Review of Systems  Last CBC Lab Results  Component Value Date   WBC 8.3 08/01/2021   HGB 14.2 08/01/2021   HCT 42.8 08/01/2021   MCV 82.5 08/01/2021   MCH 27.4 08/01/2021   RDW 12.8 08/01/2021   PLT 262 08/01/2021   Last metabolic panel Lab Results  Component Value Date   GLUCOSE 110 (H) 08/01/2021   NA 135 08/01/2021   K 3.9 08/01/2021   CL 103 08/01/2021   CO2 24 08/01/2021   BUN 18 08/01/2021   CREATININE 1.04 08/01/2021   GFRNONAA >60 08/01/2021   CALCIUM 8.9 08/01/2021   PROT 7.6 07/12/2021   ALBUMIN 4.7 07/12/2021   LABGLOB 2.9 07/12/2021   AGRATIO 1.6 07/12/2021   BILITOT 1.1 07/12/2021   ALKPHOS 60 07/12/2021   AST 36 07/12/2021   ALT 66 (H) 07/12/2021   ANIONGAP 8  08/01/2021   Last lipids Lab Results  Component Value Date   CHOL 213 (H) 07/12/2021   HDL 42 07/12/2021   LDLCALC 142 (H) 07/12/2021   TRIG 160 (H) 07/12/2021   CHOLHDL 5.1 (H) 07/12/2021   Last hemoglobin A1c Lab Results  Component Value Date   HGBA1C 5.4 07/12/2021   Last thyroid functions Lab Results  Component Value Date   TSH 0.776 07/12/2021       Objective    There were no vitals taken for this visit. BP Readings from Last 3 Encounters:  08/01/21 110/86  07/12/21 105/71  05/28/21 (!) 145/86    Wt Readings from Last 3 Encounters:  08/01/21 225 lb (102.1 kg)  07/12/21 223 lb 6.4 oz (101.3 kg)  05/28/21 220 lb (99.8 kg)     Physical Exam  ***  Last depression screening scores    07/12/2021   11:31 AM 04/02/2021    8:23 AM  08/29/2020    9:49 AM  PHQ 2/9 Scores  PHQ - 2 Score 1    PHQ- 9 Score 2       Information is confidential and restricted. Go to Review Flowsheets to unlock data.   Last fall risk screening    07/12/2021   11:32 AM  Fall Risk   Falls in the past year? 0  Number falls in past yr: 0  Injury with Fall? 0  Follow up Falls evaluation completed   Last Audit-C alcohol use screening     No data to display         A score of 3 or more in women, and 4 or more in men indicates increased risk for alcohol abuse, EXCEPT if all of the points are from question 1   No results found for any visits on 08/23/21.  Assessment & Plan    Routine Health Maintenance and Physical Exam  Exercise Activities and Dietary recommendations  Goals   None      There is no immunization history on file for this patient.  Health Maintenance  Topic Date Due   COVID-19 Vaccine (1) Never done   HIV Screening  Never done   Hepatitis C Screening  Never done   TETANUS/TDAP  Never done   INFLUENZA VACCINE  10/02/2021   HPV VACCINES  Aged Out    Discussed health benefits of physical activity, and encouraged him to engage in regular exercise appropriate for his age and condition.  Problem List Items Addressed This Visit   None    No follow-ups on file.        Carlean Jews, NP  [facility] [phone] (phone) [phone] (fax)  [facility]

## 2021-09-12 ENCOUNTER — Telehealth (INDEPENDENT_AMBULATORY_CARE_PROVIDER_SITE_OTHER): Payer: 59 | Admitting: Psychiatry

## 2021-09-12 ENCOUNTER — Encounter (HOSPITAL_COMMUNITY): Payer: Self-pay | Admitting: Psychiatry

## 2021-09-12 DIAGNOSIS — F411 Generalized anxiety disorder: Secondary | ICD-10-CM

## 2021-09-12 MED ORDER — HYDROXYZINE HCL 50 MG PO TABS
100.0000 mg | ORAL_TABLET | Freq: Three times a day (TID) | ORAL | 3 refills | Status: DC | PRN
Start: 2021-09-12 — End: 2022-01-07

## 2021-09-12 NOTE — Progress Notes (Signed)
BH MD/PA/NP OP Progress Note Virtual Visit via Video Note  I connected with Zachary Freeman on 09/12/21 at  3:00 PM EDT by a video enabled telemedicine application and verified that I am speaking with the correct person using two identifiers.  Location: Patient: Home Provider: Clinic   I discussed the limitations of evaluation and management by telemedicine and the availability of in person appointments. The patient expressed understanding and agreed to proceed.  I provided 15 minutes of non-face-to-face time during this encounter.   09/12/2021 3:12 PM Zachary Freeman  MRN:  [id number]  Chief Complaint: " I am living a good life"  HPI: 36 year old male seen today for follow up psychiatric evaluation. He has a psychiatric history of fentanyl abuse, stimulant use, opioid use, substance induced mood disorder, SI, and depression. Currently he is managed on hydroxyzine 100 mg three times daily. He notes his medication is effective in managing his psychiatric conditions.   Today he is well groomed, pleasant, cooperative, and engaged in conversation. 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  He notes that he is staying busy at work.  Patient informed writer that he was unable to conduct a GAD-7 or PHQ-9 today as he was at work.  He informed Clinical research associate that he is sleeping well and denies symptoms of anxiety or depression.  He endorses having adequate appetite.  Today he denies SI/HI/VAH, mania, paranoia.    No medication changes made today.  Patient agreeable to continue medication as prescribed.Visit Diagnosis: No diagnosis found.  Past Psychiatric History: fentanyl abuse, stimulant use, opioid use, substance induced mood disorder, SI, and depression  Past Medical History: No past medical history on file. No past surgical history on file.  Family Psychiatric History:  Father substance use (sober now), maternal grandfather alcohol use  Family History: No family history on file.  Social  History:  Social History   Socioeconomic History   Marital status: Single    Spouse name: Not on file   Number of children: Not on file   Years of education: Not on file   Highest education level: Not on file  Occupational History   Not on file  Tobacco Use   Smoking status: Some Days    Packs/day: 0.50    Years: 10.00    Total pack years: 5.00    Types: Cigarettes   Smokeless tobacco: Never  Vaping Use   Vaping Use: Not on file  Substance and Sexual Activity   Alcohol use: Not Currently   Drug use: Not Currently   Sexual activity: Yes  Other Topics Concern   Not on file  Social History Narrative   Not on file   Social Determinants of Health   Financial Resource Strain: Not on file  Food Insecurity: Not on file  Transportation Needs: Not on file  Physical Activity: Not on file  Stress: Not on file  Social Connections: Not on file    Allergies: No Known Allergies  Metabolic Disorder Labs: Lab Results  Component Value Date   HGBA1C 5.4 07/12/2021   MPG 108.28 10/22/2020   No results found for: "PROLACTIN" Lab Results  Component Value Date   CHOL 213 (H) 07/12/2021   TRIG 160 (H) 07/12/2021   HDL 42 07/12/2021   CHOLHDL 5.1 (H) 07/12/2021   VLDL 11 10/22/2020   LDLCALC 142 (H) 07/12/2021   LDLCALC 121 (H) 10/22/2020   Lab Results  Component Value Date   TSH 0.776 07/12/2021   TSH  0.610 10/22/2020    Therapeutic Level Labs: No results found for: "LITHIUM" No results found for: "VALPROATE" No results found for: "CBMZ"  Current Medications: Current Outpatient Medications  Medication Sig Dispense Refill   hydrOXYzine (ATARAX) 50 MG tablet Take 2 tablets (100 mg total) by mouth 3 (three) times daily as needed for anxiety. 180 tablet 2   Multiple Vitamin (MULTIVITAMIN) tablet Take 1 tablet by mouth daily.     No current facility-administered medications for this visit.     Musculoskeletal: Strength & Muscle Tone: within normal limits and  telehealth visit Gait & Station: normal, telehealth visit Patient leans: N/A  Psychiatric Specialty Exam: Review of Systems  There were no vitals taken for this visit.There is no height or weight on file to calculate BMI.  General Appearance: Well Groomed  Eye Contact:  Good  Speech:  Clear and Coherent and Normal Rate  Volume:  Normal  Mood:  Euthymic  Affect:  Appropriate and Congruent  Thought Process:  Coherent, Goal Directed, and Linear  Orientation:  Full (Time, Place, and Person)  Thought Content: WDL and Logical   Suicidal Thoughts:  No  Homicidal Thoughts:  No  Memory:  Immediate;   Good Recent;   Good Remote;   Good  Judgement:  Good  Insight:  Good  Psychomotor Activity:  Normal  Concentration:  Concentration: Good and Attention Span: Good  Recall:  Good  Fund of Knowledge: Good  Language: Good  Akathisia:  No  Handed:  Right  AIMS (if indicated): not done  Assets:  Communication Skills Desire for Improvement Financial Resources/Insurance Housing Physical Health Social Support Talents/Skills  ADL's:  Intact  Cognition: WNL  Sleep:  Good   Screenings: GAD-7    Flowsheet Row Office Visit from 07/12/2021 in [facility] Office Visit from 04/02/2021 in [facility] Office Visit from 08/29/2020 in [facility]  Total GAD-7 Score 5 17 2       PHQ2-9    Flowsheet Row Office Visit from 07/12/2021 in [facility] Office Visit from 04/02/2021 in [facility] Office Visit from 08/29/2020 in [facility]  PHQ-2 Total Score 1 3 0  PHQ-9 Total Score 2 5 --      Flowsheet Row ED from 08/01/2021 in [facility] EMERGENCY DEPARTMENT ED from 05/28/2021 in [facility] DEPT ED from 01/10/2021 in [facility] EMERGENCY DEPARTMENT  C-SSRS RISK CATEGORY No  Risk No Risk No Risk        Assessment and Plan: Patient notes that he is doing well on his current medication regimen.  No medication changes made today.  Patient agreeable to continue medications as prescribed.  1. Generalized anxiety disorder Continue- hydrOXYzine (ATARAX) 50 MG tablet; Take 2 tablets (100 mg total) by mouth 3 (three) times daily as needed for anxiety.  Dispense: 180 tablet; Refill: 3   Collaboration of Care: Collaboration of Care: Other provider involved in patient's care AEB    Patient/Guardian was advised Release of Information must be obtained prior to any record release in order to collaborate their care with an outside provider. Patient/Guardian was advised if they have not already done so to contact the registration department to sign all necessary forms in order for [facility] to release information regarding their care.   Consent: Patient/Guardian gives verbal consent for treatment and assignment of benefits for services provided  during this visit. Patient/Guardian expressed understanding and agreed to proceed.   Follow-up in 3 months Follow-up with therapy Shanna Cisco, NP 09/12/2021, 3:12 PM

## 2021-12-17 ENCOUNTER — Telehealth (HOSPITAL_COMMUNITY): Payer: 59 | Admitting: Psychiatry

## 2022-01-07 ENCOUNTER — Ambulatory Visit: Payer: Self-pay | Admitting: Physician Assistant

## 2022-01-07 ENCOUNTER — Encounter: Payer: Self-pay | Admitting: Physician Assistant

## 2022-01-07 VITALS — BP 130/95 | HR 83 | Ht 60.0 in | Wt 213.0 lb

## 2022-01-07 DIAGNOSIS — Z6841 Body Mass Index (BMI) 40.0 and over, adult: Secondary | ICD-10-CM

## 2022-01-07 DIAGNOSIS — G8929 Other chronic pain: Secondary | ICD-10-CM

## 2022-01-07 DIAGNOSIS — R748 Abnormal levels of other serum enzymes: Secondary | ICD-10-CM

## 2022-01-07 DIAGNOSIS — E6609 Other obesity due to excess calories: Secondary | ICD-10-CM

## 2022-01-07 DIAGNOSIS — B1920 Unspecified viral hepatitis C without hepatic coma: Secondary | ICD-10-CM

## 2022-01-07 DIAGNOSIS — M5441 Lumbago with sciatica, right side: Secondary | ICD-10-CM

## 2022-01-07 DIAGNOSIS — F1110 Opioid abuse, uncomplicated: Secondary | ICD-10-CM

## 2022-01-07 DIAGNOSIS — F17209 Nicotine dependence, unspecified, with unspecified nicotine-induced disorders: Secondary | ICD-10-CM

## 2022-01-07 DIAGNOSIS — F1410 Cocaine abuse, uncomplicated: Secondary | ICD-10-CM

## 2022-01-07 DIAGNOSIS — F3342 Major depressive disorder, recurrent, in full remission: Secondary | ICD-10-CM

## 2022-01-07 DIAGNOSIS — Z114 Encounter for screening for human immunodeficiency virus [HIV]: Secondary | ICD-10-CM

## 2022-01-07 DIAGNOSIS — E7849 Other hyperlipidemia: Secondary | ICD-10-CM

## 2022-01-07 DIAGNOSIS — F411 Generalized anxiety disorder: Secondary | ICD-10-CM

## 2022-01-07 MED ORDER — SERTRALINE HCL 50 MG PO TABS: 50.0000 mg | ORAL_TABLET | Freq: Every day | ORAL | 1 refills | Status: AC

## 2022-01-07 MED ORDER — HYDROXYZINE HCL 50 MG PO TABS: ORAL_TABLET | ORAL | 1 refills | Status: AC

## 2022-01-07 MED ORDER — GABAPENTIN 300 MG PO CAPS: 300.0000 mg | ORAL_CAPSULE | Freq: Three times a day (TID) | ORAL | 1 refills | Status: AC

## 2022-01-07 NOTE — Progress Notes (Unsigned)
   New Patient Office Visit  Subjective    Patient ID: Zachary Freeman, male    DOB: 1985-04-21  Age: 36 y.o. MRN: [id number]  CC: No chief complaint on file.   HPI Zachary Freeman presents to establish care [facility]  Mood anxiety is high; was taking hydroz before - no script now Was recently started on zoloft - 14 days  - feels a differecnce    Sleep  - 6 hours  - melatonin  - hydoxyzine did help I nthe past    Hcv - tested pos - 2009 - no treated   Low back pain - with sciatica - 1.5 - no injury trauma   Gab 300 tid was helpful - last year  -   Outpatient Encounter Medications as of 01/07/2022  Medication Sig   hydrOXYzine (ATARAX) 50 MG tablet Take 2 tablets (100 mg total) by mouth 3 (three) times daily as needed for anxiety.   Multiple Vitamin (MULTIVITAMIN) tablet Take 1 tablet by mouth daily.   No facility-administered encounter medications on file as of 01/07/2022.    No past medical history on file.  No past surgical history on file.  No family history on file.  Social History   Socioeconomic History   Marital status: Single    Spouse name: Not on file   Number of children: Not on file   Years of education: Not on file   Highest education level: Not on file  Occupational History   Not on file  Tobacco Use   Smoking status: Some Days    Packs/day: 0.50    Years: 10.00    Total pack years: 5.00    Types: Cigarettes   Smokeless tobacco: Never  Vaping Use   Vaping Use: Not on file  Substance and Sexual Activity   Alcohol use: Not Currently   Drug use: Not Currently   Sexual activity: Yes  Other Topics Concern   Not on file  Social History Narrative   Not on file   Social Determinants of Health   Financial Resource Strain: Not on file  Food Insecurity: Not on file  Transportation Needs: Not on file  Physical Activity: Not on file  Stress: Not on file  Social Connections: Not on file  Intimate Partner Violence: Not on file     ROS      Objective    There were no vitals taken for this visit.  Physical Exam  {Labs (Optional):23779}    Assessment & Plan:   Problem List Items Addressed This Visit   None   No follow-ups on file.   Loraine Grip Mayers, PA-C

## 2022-01-10 ENCOUNTER — Ambulatory Visit: Payer: Self-pay | Attending: Nurse Practitioner

## 2022-01-10 DIAGNOSIS — E7849 Other hyperlipidemia: Secondary | ICD-10-CM

## 2022-01-10 DIAGNOSIS — F411 Generalized anxiety disorder: Secondary | ICD-10-CM

## 2022-01-10 DIAGNOSIS — F3342 Major depressive disorder, recurrent, in full remission: Secondary | ICD-10-CM

## 2022-01-10 DIAGNOSIS — Z114 Encounter for screening for human immunodeficiency virus [HIV]: Secondary | ICD-10-CM

## 2022-01-10 DIAGNOSIS — B1920 Unspecified viral hepatitis C without hepatic coma: Secondary | ICD-10-CM

## 2022-01-10 DIAGNOSIS — R748 Abnormal levels of other serum enzymes: Secondary | ICD-10-CM

## 2022-01-12 LAB — LIPID PANEL
Chol/HDL Ratio: 4.6 ratio (ref 0.0–5.0)
Cholesterol, Total: 199 mg/dL (ref 100–199)
HDL: 43 mg/dL (ref >39)
LDL Chol Calc (NIH): 138 mg/dL — ABNORMAL HIGH (ref 0–99)
Triglycerides: 98 mg/dL (ref 0–149)
VLDL Cholesterol Cal: 18 mg/dL (ref 5–40)

## 2022-01-12 LAB — COMPREHENSIVE METABOLIC PANEL
ALT: 30 IU/L (ref 0–44)
AST: 16 IU/L (ref 0–40)
Albumin/Globulin Ratio: 1.6 (ref 1.2–2.2)
Albumin: 4.9 g/dL (ref 4.1–5.1)
Alkaline Phosphatase: 68 IU/L (ref 44–121)
BUN/Creatinine Ratio: 13 (ref 9–20)
BUN: 12 mg/dL (ref 6–20)
Bilirubin Total: 0.7 mg/dL (ref 0.0–1.2)
CO2: 25 mmol/L (ref 20–29)
Calcium: 9.9 mg/dL (ref 8.7–10.2)
Chloride: 103 mmol/L (ref 96–106)
Creatinine, Ser: 0.91 mg/dL (ref 0.76–1.27)
Globulin, Total: 3 g/dL (ref 1.5–4.5)
Glucose: 88 mg/dL (ref 70–99)
Potassium: 4.6 mmol/L (ref 3.5–5.2)
Sodium: 140 mmol/L (ref 134–144)
Total Protein: 7.9 g/dL (ref 6.0–8.5)
eGFR: 113 mL/min/{1.73_m2} (ref >59)

## 2022-01-12 LAB — HIV ANTIBODY (ROUTINE TESTING W REFLEX): HIV Screen 4th Generation wRfx: NONREACTIVE

## 2022-01-12 LAB — HCV RT-PCR, QUANT (NON-GRAPH)
HCV log10: 6.971 log10 IU/mL
Hepatitis C Quantitation: 9360000 IU/mL

## 2022-01-12 LAB — HCV AB W REFLEX TO QUANT PCR: HCV Ab: REACTIVE — AB

## 2022-01-12 LAB — VITAMIN D 25 HYDROXY (VIT D DEFICIENCY, FRACTURES): Vit D, 25-Hydroxy: 41.8 ng/mL (ref 30.0–100.0)

## 2022-01-14 NOTE — Addendum Note (Signed)
Addended by: Roney Jaffe on: 01/14/2022 02:18 PM   Modules accepted: Orders

## 2022-06-26 IMAGING — DX DG SHOULDER 2+V*R*
3 series · 3 of 3 positions shown · non-contrast
Comparison: None.

CLINICAL DATA: Trauma, fall, pain

EXAM:
RIGHT SHOULDER - 2+ VIEW

[shoulder grashey]
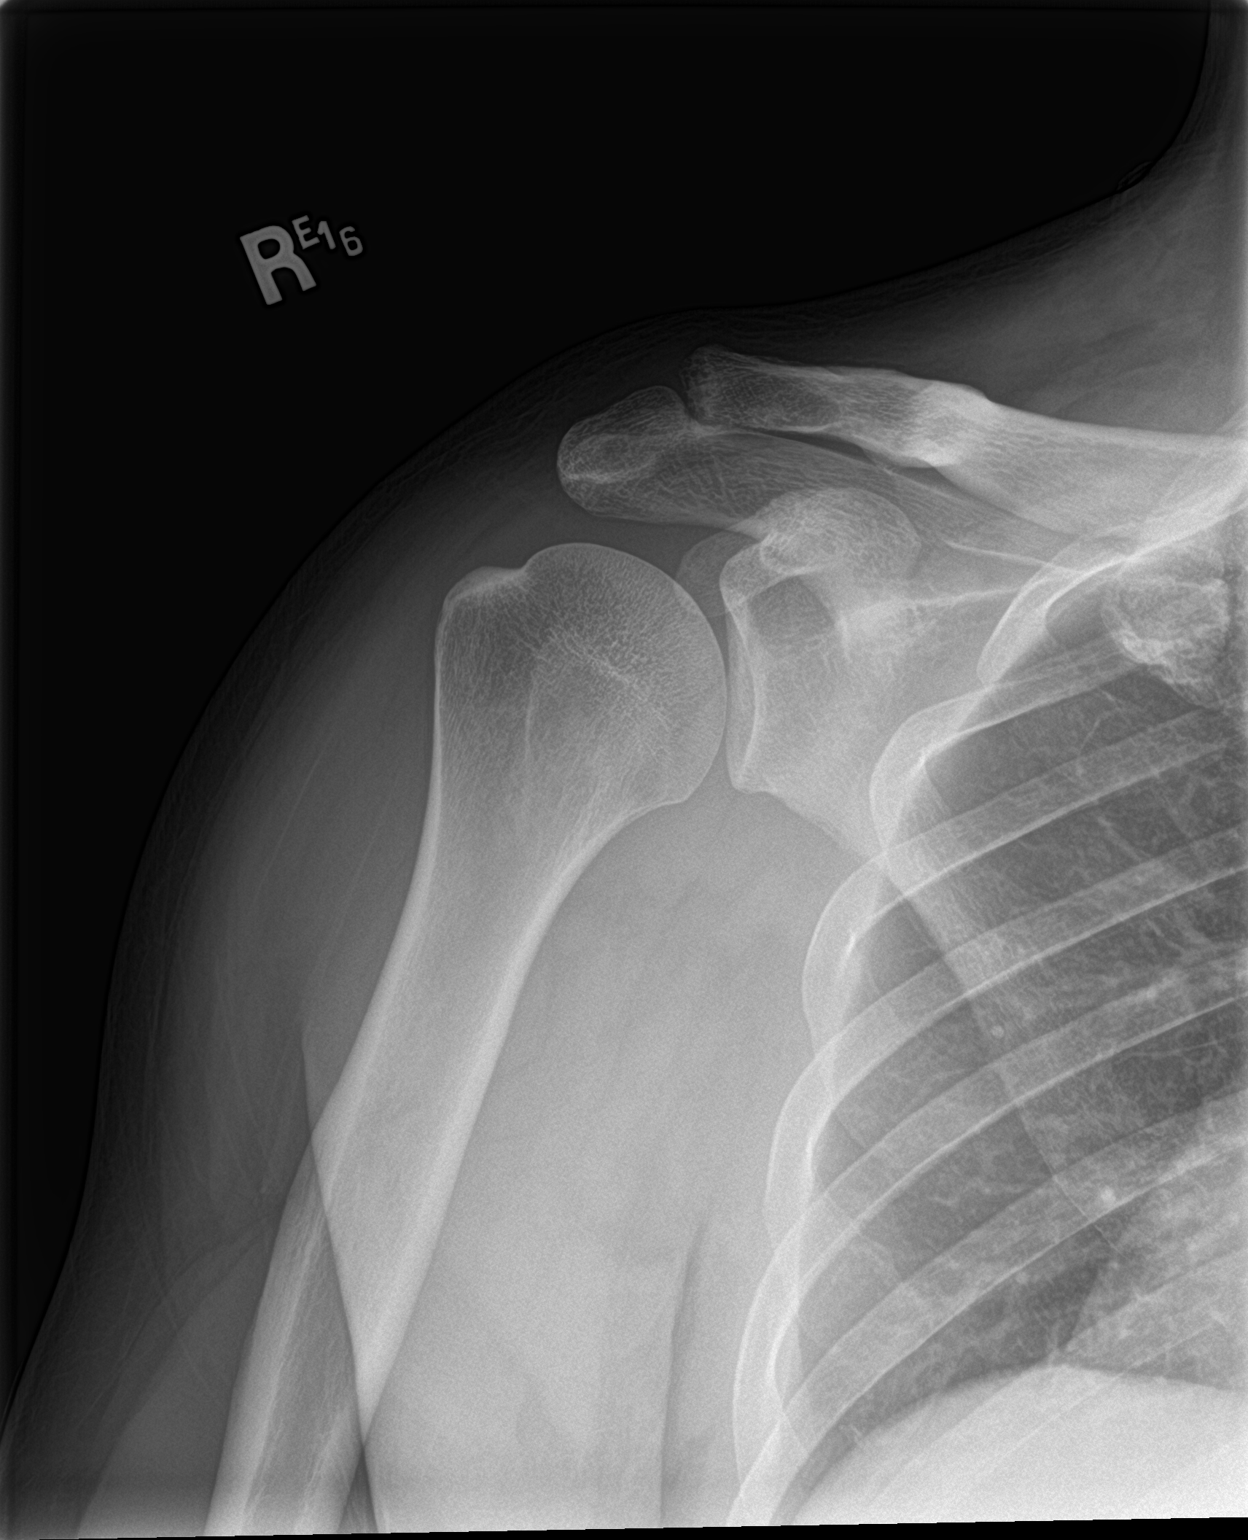

[shoulder y view]
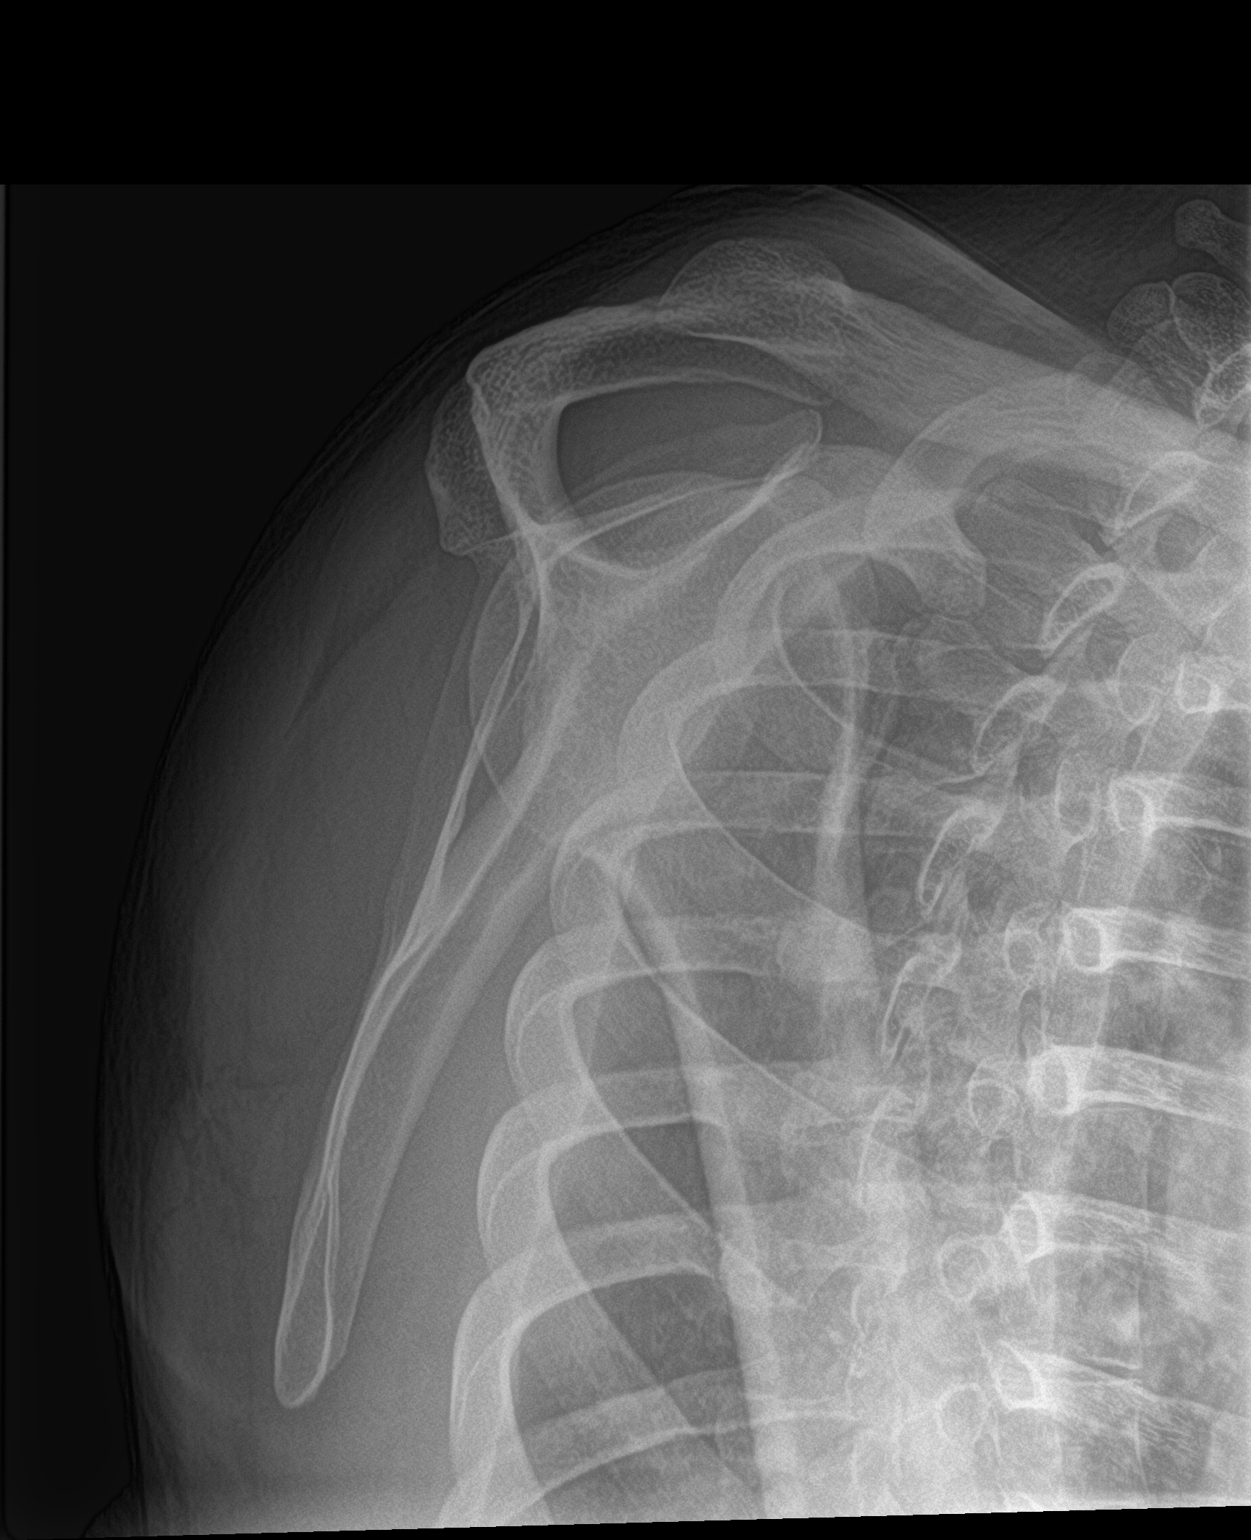

[shoulder axillary]
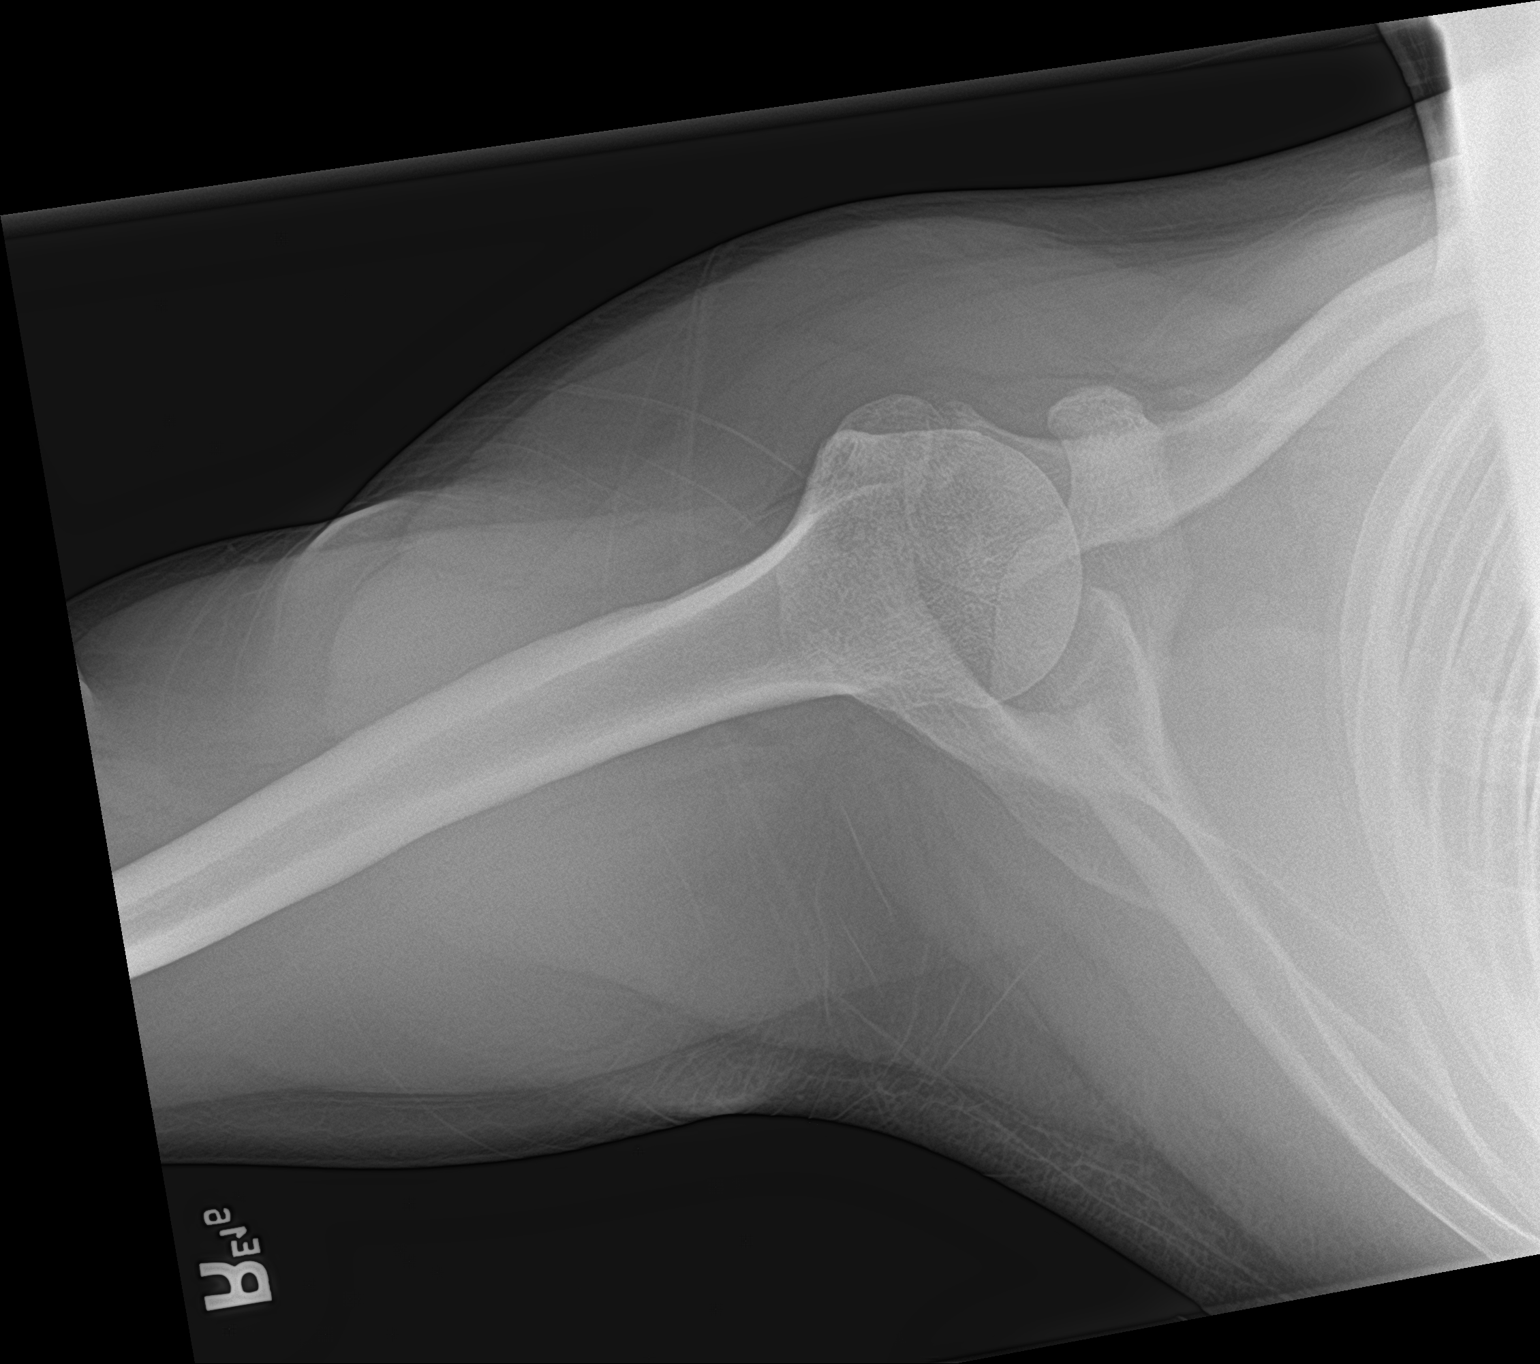

[3 of 3 positions shown; findings below may reference images not displayed]

FINDINGS: There is no evidence of fracture or dislocation. There is no
evidence of arthropathy or other focal bone abnormality. Soft
tissues are unremarkable.
IMPRESSION: No fracture or dislocation is seen.

## 2022-11-11 IMAGING — DX DG CHEST 1V PORT
1 series · 1 of 1 positions shown · non-contrast
Comparison: None.

CLINICAL DATA: Chest pain

EXAM:
PORTABLE CHEST 1 VIEW

[chest ap]
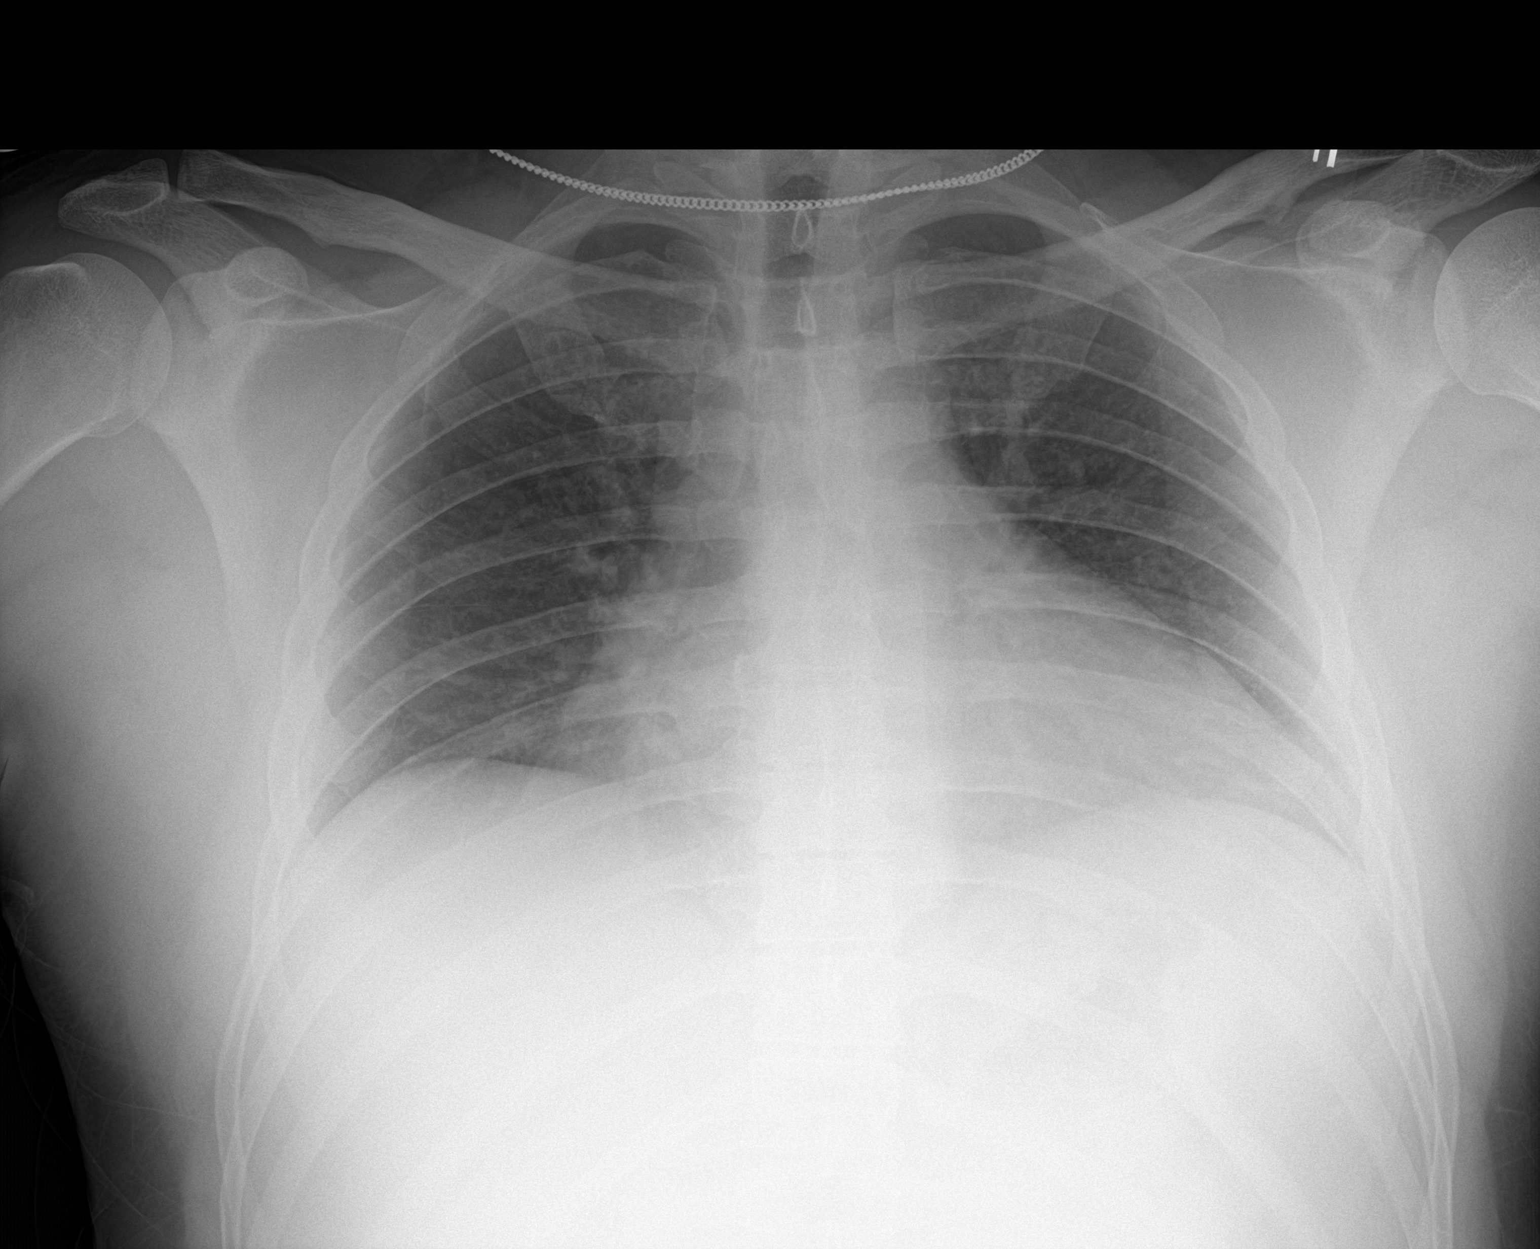

[1 of 1 positions shown; findings below may reference images not displayed]

FINDINGS: Heart appears enlarged. Mediastinum within normal limits. Lungs are
clear. No pleural effusion or pneumothorax.
IMPRESSION: No acute process identified.  Cardiomegaly.
